# Patient Record
Sex: Male | Born: 1955
Health system: Southern US, Community
[De-identification: ages and names within clinical notes are randomized; demographics above are authoritative.]

## PROBLEM LIST (undated history)

## (undated) DIAGNOSIS — T8859XA Other complications of anesthesia, initial encounter: Secondary | ICD-10-CM

## (undated) DIAGNOSIS — E785 Hyperlipidemia, unspecified: Secondary | ICD-10-CM

## (undated) DIAGNOSIS — F32A Depression, unspecified: Secondary | ICD-10-CM

## (undated) DIAGNOSIS — F1910 Other psychoactive substance abuse, uncomplicated: Secondary | ICD-10-CM

## (undated) DIAGNOSIS — T4145XA Adverse effect of unspecified anesthetic, initial encounter: Secondary | ICD-10-CM

## (undated) DIAGNOSIS — F419 Anxiety disorder, unspecified: Secondary | ICD-10-CM

## (undated) DIAGNOSIS — IMO0002 Reserved for concepts with insufficient information to code with codable children: Secondary | ICD-10-CM

## (undated) DIAGNOSIS — T7840XA Allergy, unspecified, initial encounter: Secondary | ICD-10-CM

## (undated) DIAGNOSIS — I10 Essential (primary) hypertension: Secondary | ICD-10-CM

## (undated) DIAGNOSIS — K219 Gastro-esophageal reflux disease without esophagitis: Secondary | ICD-10-CM

## (undated) DIAGNOSIS — F329 Major depressive disorder, single episode, unspecified: Secondary | ICD-10-CM

## (undated) DIAGNOSIS — K5792 Diverticulitis of intestine, part unspecified, without perforation or abscess without bleeding: Secondary | ICD-10-CM

## (undated) HISTORY — PX: SPINE SURGERY: SHX786

## (undated) HISTORY — DX: Other psychoactive substance abuse, uncomplicated: F19.10

## (undated) HISTORY — PX: OTHER SURGICAL HISTORY: SHX169

## (undated) HISTORY — DX: Diverticulitis of intestine, part unspecified, without perforation or abscess without bleeding: K57.92

## (undated) HISTORY — PX: NECK SURGERY: SHX720

## (undated) HISTORY — DX: Depression, unspecified: F32.A

## (undated) HISTORY — PX: HERNIA REPAIR: SHX51

## (undated) HISTORY — DX: Allergy, unspecified, initial encounter: T78.40XA

## (undated) HISTORY — DX: Major depressive disorder, single episode, unspecified: F32.9

## (undated) HISTORY — DX: Gastro-esophageal reflux disease without esophagitis: K21.9

## (undated) HISTORY — DX: Hyperlipidemia, unspecified: E78.5

## (undated) HISTORY — DX: Anxiety disorder, unspecified: F41.9

## (undated) SURGERY — COLONOSCOPY WITH PROPOFOL
Anesthesia: Monitor Anesthesia Care

---

## 2004-04-19 ENCOUNTER — Ambulatory Visit (HOSPITAL_COMMUNITY): Admission: RE | Admit: 2004-04-19 | Discharge: 2004-04-19 | Payer: Self-pay | Admitting: Internal Medicine

## 2006-01-10 ENCOUNTER — Ambulatory Visit: Payer: Self-pay | Admitting: Internal Medicine

## 2006-02-13 ENCOUNTER — Ambulatory Visit (HOSPITAL_COMMUNITY): Admission: RE | Admit: 2006-02-13 | Discharge: 2006-02-13 | Payer: Self-pay | Admitting: Internal Medicine

## 2006-02-13 ENCOUNTER — Ambulatory Visit: Payer: Self-pay | Admitting: Internal Medicine

## 2011-01-14 NOTE — Op Note (Signed)
NAME:  James Simpson, James Simpson                         ACCOUNT NO.:  [id number]   MEDICAL RECORD NO.:  [id number]                   PATIENT TYPE:  AMB   LOCATION:  DAY                                  FACILITY:  [facility]   PHYSICIAN:  Lionel December, M.D.                 DATE OF BIRTH:  10-16-1955   DATE OF PROCEDURE:  04/19/2004  DATE OF DISCHARGE:                                 OPERATIVE REPORT   PROCEDURE:  Total colonoscopy with polypectomy.   INDICATIONS:  James Simpson is a 55 year old Caucasian male who has intermittent  episodes of rectal pain usually occurring at night without any other  symptoms. 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 Family history is positive  for colon carcinoma in his mother. He is therefore undergoing high-risk  screening colonoscopy. Procedure and risks was reviewed with the patient and  informed consent was obtained.   PREOPERATIVE MEDICATIONS:  Demerol 50 mg IV, Versed 10 mg IV in divided  doses.   FINDINGS:  Procedure performed in endoscopy suite. The patient's vital signs  and O2 saturations were monitored during procedure and remained stable. The  patient was placed in the left lateral position and rectal examination  performed. No abnormality noted on external or digital exam. Olympus video  scope was placed in the rectum and advanced into sigmoid colon. A few small  diverticula were noted. Preparation was felt to be satisfactory. Scope was  easily passed to the cecum which was identified by appendiceal orifice and  ileocecal valve. There was a 6 to 7 mm flat polyp on a fold below the  appendiceal orifice. This was raised with submucosal injection of saline and  snared in one piece. As the scope was withdrawn, the rest of the colonic  mucosa was carefully examined. There was another polyp at descending colon.  This was cold snared and partly coagulated using snare tip. Small piece of  this polyp was retrieved for histologic examination. The rest of the colonic  mucosa was normal. The rectal mucosal similarly was normal.  Scope was  retroflexed to examine anorectal junction, and small hemorrhoids were noted  below the dentate line. The scope was straightened and withdrawn. The  patient tolerated the procedure well.   FINAL DIAGNOSES:  1. Two polyps snared, one sessile polyp 6 to 7 mm snared from cecum and     other one from descending colon.  2. Few small diverticula at sigmoid colon.  3. Small external hemorrhoids.   RECOMMENDATIONS:  1. Standard instructions given.  2. High fiber diet.  3. Citrucel 1 tablespoonful daily.  4. He will use Levsin SL on p.r.n. basis. When he has pain, he can take 2 at     a time, no more than 6 in a 24-hour period.  5. I will be contacting patient with biopsy results for further     recommendations.      ___________________________________________  Lionel December, M.D.   NR/MEDQ  D:  04/19/2004  T:  04/19/2004  Job:  [id number]   cc:   Wyvonnia Lora  [address]  [place]  [place] [address]  Fax: [phone]

## 2011-01-14 NOTE — H&P (Signed)
NAME:  James Simpson, James Simpson NO.:  [id number]   MEDICAL RECORD NO.:  [id number]           PATIENT TYPE:   LOCATION:                                 FACILITY:   PHYSICIAN:  Lionel December, M.D.    DATE OF BIRTH:  10/24/1955   DATE OF ADMISSION:  DATE OF DISCHARGE:  LH                                HISTORY & PHYSICAL   PRIMARY CARE PHYSICIAN:  Dr. Margo Common   REASON FOR CONSULTATION:  Follow up diverticulitis.   HISTORY OF PRESENT ILLNESS:  Mr. Mahler is a 55 year old Caucasian male  who developed diverticulitis the evening of November 23, 2005.  He began to  have severe left lower quadrant pain, rated the pain 10/10 on pain scale,  was associated with diarrhea as well as some rectal bleeding.  He did have a  low-grade fever.  He states he had been consuming a good amount of almonds  and peanuts prior to the onset of this.  He also was found to have  microscopic hematuria as well.  He was hospitalized for about 5 days, was  treated with Cipro and Flagyl, and responded well to this.  Rarely he has a  twinge of left lower quadrant residual pain but nothing significant.  His  bowel movements are now normal, once a day, without any further rectal  bleeding.  They are soft and brown.  His appetite has returned to normal.  He has no further fever or chills, nausea or vomiting.  He is doing well.   While hospitalized, his CT scan on November 24, 2005, showed inflammatory  changes seen adjacent to the mid sigmoid colon consistent with  diverticulitis.  This study, however, was done without contrast.  He had  white blood cell count of 11,000 at the time of admission.  This later  normalized.  His last colonoscopy was by Dr. Karilyn Cota on April 19, 2004.  He  had two polyps removed - one was inflamed and focally adenomatous measuring  8 x 3 mm from the cecum.  The second was from the descending colon which was  a small inflamed adenomatous polyp.  He was also found to have a few small  diverticula at the sigmoid colon and small external hemorrhoids.  Family  history is positive for his mother diagnosed in her 98s with a colon cancer.   PAST MEDICAL HISTORY:  1.  Colonoscopy with history of adenomatous polyps.  2.  Diverticulosis as described in HPI.  3.  He had a right rotator cuff repair.  4.  History of EGD in February 1995 by Dr. Jena Gauss for dyspepsia which showed      erosive reflux esophagitis.  5.  Right inguinal herniorrhaphy.  6.  Total fixation of a left clavicular fracture which he had in 1978.  Positive for his mother diagnosed in her 4s with a colon cancer.   CURRENT MEDICATIONS:  Cod liver oil one teaspoon in the morning.   ALLERGIES:  PENICILLIN, unknown reaction.   FAMILY HISTORY:  Positive for mother diagnosed in her 8s with colon cancer,  deceased at  age 78.  Father deceased at age 63 secondary to MI.  He has  three healthy siblings.   SOCIAL HISTORY:  Mr. Beckom is in his second marriage of 21 years.  He has  one son that resides with he and his wife, another grown healthy child.  He  is employed full-time with [employer].  He denies any tobacco or  drug use.  He drinks an occasional beer a couple of times a month.   REVIEW OF SYSTEMS:  CONSTITUTIONAL:  Weight is stable.  Denies any anorexia.  Denies any fever or chills.  Denies any __________.  CARDIOVASCULAR:  Denies  chest pain, palpitation.  PULMONARY:  Denies any shortness of breath,  dyspnea, cough, hemoptysis. GI:  See HPI.  Denies any heartburn,  indigestion, dysphagia, or odynophagia.  GU:  Denies any dysuria, gross  hematuria, increased urinary frequency.  He has had microscopic hematuria  and he is followed by Dr. Margo Common for this.   PHYSICAL EXAMINATION:  VITAL SIGNS:  Weight 242 pounds, height 70 inches,  temperature 98 degrees, blood pressure 142/90, pulse 80.  GENERAL:  Mr. Hanna is a 55 year old Caucasian male who is alert and  pleasant, obese, and cooperative  in no acute distress.  HEENT:  Normocephalic.  Sclerae clear, nonicteric.  Conjunctivae pink.  Oropharynx pink and moist without any lesions.  NECK:  Supple without mass or thyromegaly.  CHEST:  Heart regular rate and rhythm, normal S1, S2, without murmurs, rubs,  or gallops.  LUNGS:  Clear to auscultation bilaterally.  ABDOMEN:  Protuberant with positive bowel sounds x4, no bruits ausculted.  Soft, nontender, nondistended, without palpable mass or hepatosplenomegaly.  No rebound tenderness or guarding.  RECTAL:  Deferred.  EXTREMITIES:  Without clubbing or edema bilaterally.  SKIN:  Pink, warm, and dry with multiple macules, no rash or jaundice.   LABORATORY STUDIES:  See HPI.   IMPRESSION:  Mr. Mcaulay is a 55 year old Caucasian male with first bout of  diverticulosis which began end of March.  He was treated with antibiotics  and responded nicely, was hospitalized for about 5 days at [facility].  He has recovered well.  He has history of adenomatous polyps as  well as family history of colorectal carcinoma.  Therefore, we are going to  pursue a repeat colonoscopy, given findings of recent CT where there is what  appears to be acute sigmoid diverticulitis has resolved and to be sure that  there is no occult mass.   PLAN:  1.  Colonoscopy with Dr. Karilyn Cota in the near future.  I have discussed the      procedure including risks and benefits      to include but not limited to bleeding, infection, perforation, drug      reaction.  He agrees with plan.  Consent will be obtained.  2.  Further recommendations pending procedure.      Nicholas Lose, N.P.      Lionel December, M.D.  Electronically Signed    KC/MEDQ  D:  01/10/2006  T:  01/10/2006  Job:  [id number]   cc:   Wyvonnia Lora  Fax: [phone]   Dr. Juventino Slovak, [place]

## 2011-01-14 NOTE — Op Note (Signed)
NAME:  James Simpson, James Simpson NO.:  [id number]   MEDICAL RECORD NO.:  [id number]          PATIENT TYPE:  AMB   LOCATION:  DAY                           FACILITY:  [facility]   PHYSICIAN:  Lionel December, M.D.    DATE OF BIRTH:  March 26, 1956   DATE OF PROCEDURE:  02/13/2006  DATE OF DISCHARGE:                                 OPERATIVE REPORT   PROCEDURE:  Colonoscopy.   INDICATIONS:  [name] is a 55 year old Caucasian male who was recently treated  at [facility] in [place], [place] for diverticulitis.  He has completely  recovered with antibiotic therapy, although he still has some internal mild  soreness across lower abdomen.  He also has history of polyps.  His last  exam was on August 2005 with removal of two tubular adenomas.  Family  history is positive for colon carcinoma in his mother and two maternal  aunts.  Given history of recent diverticulitis, we decided to reexamine his  colon to make sure he does not have a neoplastic process mimicking the  diverticulitis.  Procedure risks were reviewed with the patient, informed  consent was obtained.   MEDS FOR CONSCIOUS SEDATION:  Demerol 50 mg IV, Versed 4 mg IV.   FINDINGS:  Procedure performed in endoscopy suite.  The patient's vital  signs and O2 sat were monitored during procedure and remained stable.  The  patient was placed in left lateral position.  Rectal examination performed.  No abnormality noted on external or digital exam.  Olympus videoscope was  placed in rectum and advanced under vision into sigmoid colon where few  scattered diverticula noted.  There were no mucosal abnormalities suggest  colitis.  Preparation was excellent.  Scope was passed into cecum which was  identified by appendiceal orifice and ileocecal valve.  Short segment of TI  was also examined was normal.  As the scope was withdrawn colonic mucosa was  examined for the second time and no polyps or neoplasm was noted.  Rectal  mucosa was normal.  Scope  was retroflexed to examine anorectal junction.  A  small hemorrhoids noted below the dentate line.  Endoscope was then  withdrawn.  The patient tolerated the procedure well.   FINAL DIAGNOSIS:  1.  Mild-to-moderate sigmoid colon diverticulosis.  2.  No evidence of neoplasm or recurrent polyps.  3.  Small external hemorrhoids.   RECOMMENDATIONS:  High-fiber diet plus Fiberchoice 2 tablets daily.  He  should continue with yearly Hemoccults and consider next exam in 5 years  from now, which would be in June 2012.      Lionel December, M.D.  Electronically Signed     NR/MEDQ  D:  02/13/2006  T:  02/14/2006  Job:  [id number]   cc:   Wyvonnia Lora  Fax: [phone]

## 2011-01-14 NOTE — Consult Note (Signed)
NAME:  James Simpson, SCHOOLS NO.:  [id number]   MEDICAL RECORD NO.:  [id number]                   PATIENT TYPE:   LOCATION:                                       FACILITY:   PHYSICIAN:  Lionel December, M.D.                 DATE OF BIRTH:  Apr 27, 1956   DATE OF CONSULTATION:  03/29/2004  DATE OF DISCHARGE:                                   CONSULTATION   PRESENTING COMPLAINT:  Rectal pain.  Family history of colon carcinoma.  The  patient interested in colonoscopy.   HISTORY OF PRESENT ILLNESS:  Dam is a 55 year old Caucasian male who is  referred through courtesy of Dr. Margo Common for GI evaluation.  He states he has  been having sporadic intermittent episodes of rectal pain starting about a  year ago.  He believes he has had four or five episodes.  All of these  episodes have occurred at night between 2 and 3 a.m.  He wakes up with  excruciating pain in his rectum.  He feels nauseated, becomes diaphoretic,  dizzy and feels flushed.  Each episode has lasted anywhere from 10-25  minutes.  During this process he may have one or two bowel movements, but  these are normal.  Between these episodes he has no problems whatsoever.  His bowels generally move daily.  He denies melena, rectal bleeding or  anorexia or weight loss.  He has very good appetite.  He also denies  dysuria, hematuria and has not any difficulty with sexual activity and does  not recall that this pain occurred following sexual activity.  He denies  heartburn or chest pain.  He is on herbal supplements which are MSM, red  yeast, fionine, silymarin.  He was given Levsin /SL to be used on p.r.n.  basis by Dr. Margo Common, but he has not taken this since he has not had any  pain.  He did have rectal exam by him which was within normal limits.   PAST MEDICAL HISTORY:  He had an EGD back in February 1995 by Dr. Jena Gauss for  dyspeptic symptoms.  He had erosive reflux esophagitis.  He was treated with  PPI for a  while, but has remained asymptomatic off therapy.  Prior surgeries  include right inguinal herniorrhaphy as a child, total fixation of left  clavicular fracture that he had in 1978 while skiing and more recently had  surgery for right rotator cuff tear in 1999 or 2000.   ALLERGIES:  PENICILLIN.  Reaction unknown.  He apparently was given this  when he was a child.   FAMILY HISTORY:  Mother was diagnosed with colon carcinoma at age 15 and  died within 10 months of metastatic disease.  Father had coronary artery  disease, had a coronary artery bypass graft at 26 and died of myocardial  infarction at 31.  He has two sisters and one brother.  One sister has  hypertension, depression.  Another sister is diabetic and has thyroid  disease as well as pacemaker.  His brother is doing fairly well.   SOCIAL HISTORY:  He is married and he has two children.  He worked for a  [employer], but for the last four years he has been working at [employer] in __________.  He has never smoked cigarettes and drinks alcohol  very occasionally.   PHYSICAL EXAMINATION:  GENERAL:  Pleasant mildly obese Caucasian male who is  in no acute distress.  VITAL SIGNS:  He weighs 242 pounds. He is 5 feet 10 1/2 inches tall, pulse  78 per minute, blood pressure 120/88.  He is afebrile.  HEENT:  Conjunctivae is pink.  Sclerae is nonicteric.  Oropharyngeal mucosa  is normal.  No neck masses are noted.  CARDIAC:  Regular rhythm.  Normal S1, S2.  No murmur or gallop noted.  LUNGS:  Clear to auscultation.  ABDOMEN:  Full, but soft and nontender without organomegaly or masses.  GENITALIA:  External genitalia is within normal limits.  RECTAL:  Deferred as he had one by Dr. Margo Common and he will get another at  time of colonoscopy.  EXTREMITIES:  No clubbing or edema noted.   ASSESSMENT:  Lajarvis is a healthy-appearing 55 year old Caucasian male with  sporadic episodes of excruciating rectal pain occurring usually at  night.  He does not have any other worrisome symptoms.  I agree he is either having  rectal spasms or he has proctalgia fugax.  I agree in trial with Levsin /SL.   Family history of colon carcinoma in mother.  He therefore needs to undergo  high risk screening colonoscopy.   RECOMMENDATIONS:  Total colonoscopy to be performed at [facility] in near future.  I have reviewed with the procedure and risks with the patient and he is  agreeable.   I have asked him to keep a written record when he has these episodes of pain  and look at the day before to see if he did something unusual or different  than his routine.  I may have him come back for follow visit in six months  or so.   I would like to thank Dr. Margo Common for the opportunity to participate in the  care of this gentleman.      ___________________________________________                                            Lionel December, M.D.   NR/MEDQ  D:  03/29/2004  T:  03/29/2004  Job:  [id number]   cc:   Wyvonnia Lora  [address]  [place]  [place] [address]  Fax: [phone]   [facility]

## 2011-02-10 ENCOUNTER — Other Ambulatory Visit (INDEPENDENT_AMBULATORY_CARE_PROVIDER_SITE_OTHER): Payer: Self-pay | Admitting: Internal Medicine

## 2011-02-10 ENCOUNTER — Ambulatory Visit (HOSPITAL_COMMUNITY)
Admission: RE | Admit: 2011-02-10 | Discharge: 2011-02-10 | Disposition: A | Discharge status: Home / Self Care | Payer: BC Managed Care – PPO | Source: Ambulatory Visit | Attending: Internal Medicine | Admitting: Internal Medicine

## 2011-02-10 ENCOUNTER — Encounter (HOSPITAL_BASED_OUTPATIENT_CLINIC_OR_DEPARTMENT_OTHER): Payer: BC Managed Care – PPO | Admitting: Internal Medicine

## 2011-02-10 DIAGNOSIS — D126 Benign neoplasm of colon, unspecified: Secondary | ICD-10-CM | POA: Insufficient documentation

## 2011-02-10 DIAGNOSIS — K644 Residual hemorrhoidal skin tags: Secondary | ICD-10-CM | POA: Insufficient documentation

## 2011-02-10 DIAGNOSIS — K5730 Diverticulosis of large intestine without perforation or abscess without bleeding: Secondary | ICD-10-CM

## 2011-02-10 DIAGNOSIS — Z860100 Personal history of colon polyps, unspecified: Secondary | ICD-10-CM | POA: Insufficient documentation

## 2011-02-10 DIAGNOSIS — Z800 Family history of malignant neoplasm of digestive organs: Secondary | ICD-10-CM

## 2011-02-10 DIAGNOSIS — Z09 Encounter for follow-up examination after completed treatment for conditions other than malignant neoplasm: Secondary | ICD-10-CM | POA: Insufficient documentation

## 2011-02-28 NOTE — Op Note (Signed)
NAME:  DEMETRY, BENDICKSON NO.:  [id number]  MEDICAL RECORD NO.:  [id number]  LOCATION:  DAYP                          FACILITY:  [facility]  PHYSICIAN:  Lionel December, M.D.    DATE OF BIRTH:  15-Jun-1956  DATE OF PROCEDURE:  02/10/2011 DATE OF DISCHARGE:                              OPERATIVE REPORT   PROCEDURE:  Colonoscopy with polypectomy.  INDICATIONS:  James Simpson is a 55 year old Caucasian male who has a history of colonic polyps as well as family history of colon carcinoma.  He had 2 tubular adenomas removed in 2005, but his last exam in June 2007 was unremarkable.  He is now coming for surveillance colonoscopy.  He states he has been treated for diverticulitis at least 6 times in his lifetime but never had an abscess and most of his therapies were outpatient.  His mother had colon carcinoma in late 20s and died in her 75s of metastatic disease and he has lost 2 maternal aunts also had colon carcinoma either in their late 78s or early 69s.  Procedure risks were reviewed with the patient.  Informed consent was obtained.  MEDICATIONS FOR CONSCIOUS SEDATION:  Demerol 50 mg IV, Versed 5 mg IV.  FINDINGS:  Procedure performed in endoscopy suite.  The patient's vital signs and O2 saturations were monitored during the procedure and remained stable.  The patient was placed in left lateral recumbent position and rectal examination performed.  No abnormality noted on external or digital exam.  Pentax videoscope was placed in the rectum and advanced under vision into sigmoid colon and beyond.  He had moderate number of diverticula in the sigmoid colon, but these were small to medium sized.  Preparation was excellent.  Scope was passed into cecum which was identified by appendiceal orifice and ileocecal valve.  To the right of appendiceal orifice was a flat or sessile polyp. It was about 6 x 8 mm.  It was elevated with saline injection and snared.  Mucosa around the polypectomy  site was treated with APC just to be sure that all of the lesion had been treated.  There was 3 to 4 mm polyp at ascending colon which was removed via  biopsy.  Initially I tried a snare, but the snare slipped over it, so it was taken out with the cold biopsy forceps.  There was another 6 to 7-mm polyp at distal sigmoid colon.  This polyp was snared.  Most of the polyp was coagulated during this process.  Small portion ofthis polyp was obtained for  histologic exam.  Rectal mucosa was normal. Scope was retroflexed to examine anorectal junction and small hemorrhoids noted below the dentate line.   Endoscope was straightened and withdrawn.  Withdrawal time was 37 minutes.   The patient tolerated the procedure well.  FINAL DIAGNOSES: 1. Examination performed to cecum. 2. A 6 x 8-mm sessile polyp snared from cecum following injection of     saline.  The polypectomy site was also treated with APC. 3. Small polyp ablated via cold biopsy from ascending colon and     another small polyp snared from distal sigmoid colon. 4. Moderate number of diverticula at sigmoid colon.  Small external     hemorrhoids.  RECOMMENDATIONS: 1. No aspirin or NSAIDs for 2 weeks. 2. He should continue with high-fiber diet and fiber supplement.  I     will be contacting the patient results of biopsy and further     recommendations.          ______________________________ Lionel December, M.D.     NR/MEDQ  D:  02/10/2011  T:  02/10/2011  Job:  [id number]  Electronically Signed by Lionel December M.D. on 02/28/2011 12:51:22 AM

## 2013-03-18 ENCOUNTER — Ambulatory Visit (INDEPENDENT_AMBULATORY_CARE_PROVIDER_SITE_OTHER): Payer: BC Managed Care – PPO | Admitting: Internal Medicine

## 2013-03-18 ENCOUNTER — Encounter (INDEPENDENT_AMBULATORY_CARE_PROVIDER_SITE_OTHER): Payer: Self-pay | Admitting: Internal Medicine

## 2013-03-18 VITALS — BP 144/90 | HR 76 | Temp 98.3°F | Ht 71.0 in | Wt 247.3 lb

## 2013-03-18 DIAGNOSIS — K5732 Diverticulitis of large intestine without perforation or abscess without bleeding: Secondary | ICD-10-CM

## 2013-03-18 MED ORDER — CIPROFLOXACIN HCL 500 MG PO TABS: 500.0000 mg | ORAL_TABLET | Freq: Two times a day (BID) | ORAL | Status: DC

## 2013-03-18 MED ORDER — METRONIDAZOLE 500 MG PO TABS: 500.0000 mg | ORAL_TABLET | Freq: Two times a day (BID) | ORAL | Status: DC

## 2013-03-18 NOTE — Patient Instructions (Addendum)
Referral to Dr. Lovell Sheehan. Cipro and Flagyl x 10 days . Next bout he will call our office for a CT.

## 2013-03-18 NOTE — Progress Notes (Signed)
Subjective:     Patient ID: James Simpson, male   DOB: 1955/12/29, 57 y.o.   MRN: [id number]  HPI Here today for f/u. He tells me he is still having flares from the diverticulitis. He tells me he had a diverticular flare 1 1/2 weeks ago. He was taking Cipro and Flagyl for the diverticulitis. Usually takes for 7 days. He was treated by Dr. Loney Hering. No xrays were taken.  He has had 5 episodes of diverticulitis in the past 8-9 months. He tells me he had the same symptoms as before when he had diverticulitis.  He had left lower abdominal pain. He also tells me he doesn't void like he should when he has a flare.  He also bloats and passes gas. During episodes of diverticulitis he becomes constipated. CT in 2007 revealed inflammatory changes seen adjacent to the mid sigmoid colon consistent with diverticulitis. Appetitie is good. No weight loss. Usually has a BM daily. No melena or bright red rectal bleeding.     02/10/2011 Colonscopy with polypectomy   1. Examination performed to cecum.   2. A 6 x 8-mm sessile polyp snared from cecum following injection of  saline. The polypectomy site was also treated with APC.  3. Small polyp ablated via cold biopsy from ascending colon and  another small polyp snared from distal sigmoid colon.  4. Moderate number of diverticula at sigmoid colon. Small external  hemorrhoids.   Review of Systems see hpi Current Outpatient Prescriptions  Medication Sig Dispense Refill  . ciprofloxacin (CIPRO) 500 MG tablet Take 1 tablet (500 mg total) by mouth 2 (two) times daily.  20 tablet  0  . metroNIDAZOLE (FLAGYL) 500 MG tablet Take 1 tablet (500 mg total) by mouth 2 (two) times daily.  20 tablet  0   No current facility-administered medications for this visit.   No current outpatient prescriptions on file prior to visit.   No current facility-administered medications on file prior to visit.   Past Surgical History  Procedure Laterality Date  . Hernia repair     child  . Rt rotator cuff     Allergies  Allergen Reactions  . Penicillins     Unknown reaction       Objective:   Physical Exam  Filed Vitals:   03/18/13 1554  BP: 144/90  Pulse: 76  Temp: 98.3 F (36.8 C)  Height: 5\' 11"  (1.803 m)  Weight: 247 lb 4.8 oz (112.175 kg)   Alert and oriented. Skin warm and dry. Oral mucosa is moist.   . Sclera anicteric, conjunctivae is pink. Thyroid not enlarged. No cervical lymphadenopathy. Lungs clear. Heart regular rate and rhythm.  Abdomen is soft. Bowel sounds are positive. No hepatomegaly. No abdominal masses felt. No tenderness.  No edema to lower extremities.       Assessment:    Recent hx of diverticulitis. Dr. Karilyn Cota in with patient.  Patient is over bout at this time. Asymptomatic at this time.     Plan:     Referral to Dr. Lovell Sheehan. Cipro and Flagyl x 10 days. He will call our office when his next bout occurs and we will CT him.

## 2013-03-26 ENCOUNTER — Telehealth (INDEPENDENT_AMBULATORY_CARE_PROVIDER_SITE_OTHER): Payer: Self-pay | Admitting: *Deleted

## 2013-03-26 ENCOUNTER — Other Ambulatory Visit (INDEPENDENT_AMBULATORY_CARE_PROVIDER_SITE_OTHER): Payer: Self-pay | Admitting: Internal Medicine

## 2013-03-26 DIAGNOSIS — K5792 Diverticulitis of intestine, part unspecified, without perforation or abscess without bleeding: Secondary | ICD-10-CM

## 2013-03-26 NOTE — Telephone Encounter (Signed)
Advised to go to the ED. I will however order a CT abdomen/pelvis with CM. He does not want to go.

## 2013-03-26 NOTE — Telephone Encounter (Signed)
Saw you last week.  Was told to take antibx --pain in not letting up and was told if it continued that we may order a scan.   He would like to proceed if that is what you think needs to be done.  Please call him with your recommendations [phone]

## 2013-03-27 NOTE — Progress Notes (Signed)
Patient has to have CT sch'd through [facility], they are aware and will call patient with appt

## 2013-03-28 ENCOUNTER — Other Ambulatory Visit (INDEPENDENT_AMBULATORY_CARE_PROVIDER_SITE_OTHER): Payer: Self-pay | Admitting: Internal Medicine

## 2013-03-28 ENCOUNTER — Telehealth (INDEPENDENT_AMBULATORY_CARE_PROVIDER_SITE_OTHER): Payer: Self-pay | Admitting: Internal Medicine

## 2013-03-28 DIAGNOSIS — K5732 Diverticulitis of large intestine without perforation or abscess without bleeding: Secondary | ICD-10-CM

## 2013-03-28 MED ORDER — CIPROFLOXACIN HCL 500 MG PO TABS: 500.0000 mg | ORAL_TABLET | Freq: Two times a day (BID) | ORAL | Status: DC

## 2013-03-28 MED ORDER — METRONIDAZOLE 500 MG PO TABS: 250.0000 mg | ORAL_TABLET | Freq: Three times a day (TID) | ORAL | Status: DC

## 2013-03-28 NOTE — Telephone Encounter (Signed)
CT abdomen/pelvis reviewed with patient. Patient has acute diverticulitis. I advised him if his pain worsens or he feels he needs to go to the ED, he needs to go. I eprescribed an Rx for Cipro and Flagyl to his pharmacy. He has an appt with Dr. Lovell Sheehan on the 5th of August.

## 2013-04-01 ENCOUNTER — Encounter (HOSPITAL_COMMUNITY): Payer: Self-pay | Admitting: Emergency Medicine

## 2013-04-01 ENCOUNTER — Emergency Department (HOSPITAL_COMMUNITY)
Admission: EM | Admit: 2013-04-01 | Discharge: 2013-04-01 | Disposition: A | Discharge status: Home / Self Care | Payer: BC Managed Care – PPO | Attending: Emergency Medicine | Admitting: Emergency Medicine

## 2013-04-01 ENCOUNTER — Emergency Department (HOSPITAL_COMMUNITY): Payer: BC Managed Care – PPO

## 2013-04-01 DIAGNOSIS — K5732 Diverticulitis of large intestine without perforation or abscess without bleeding: Secondary | ICD-10-CM | POA: Insufficient documentation

## 2013-04-01 DIAGNOSIS — Z880 Allergy status to penicillin: Secondary | ICD-10-CM | POA: Insufficient documentation

## 2013-04-01 DIAGNOSIS — R109 Unspecified abdominal pain: Secondary | ICD-10-CM | POA: Insufficient documentation

## 2013-04-01 DIAGNOSIS — K5792 Diverticulitis of intestine, part unspecified, without perforation or abscess without bleeding: Secondary | ICD-10-CM

## 2013-04-01 DIAGNOSIS — K5900 Constipation, unspecified: Secondary | ICD-10-CM | POA: Insufficient documentation

## 2013-04-01 DIAGNOSIS — Z792 Long term (current) use of antibiotics: Secondary | ICD-10-CM | POA: Insufficient documentation

## 2013-04-01 DIAGNOSIS — Z79899 Other long term (current) drug therapy: Secondary | ICD-10-CM | POA: Insufficient documentation

## 2013-04-01 LAB — COMPREHENSIVE METABOLIC PANEL
ALT: 42 U/L (ref 0–53)
AST: 45 U/L — ABNORMAL HIGH (ref 0–37)
Albumin: 3.7 g/dL (ref 3.5–5.2)
CO2: 24 mEq/L (ref 19–32)
Calcium: 9.3 mg/dL (ref 8.4–10.5)
Chloride: 104 mEq/L (ref 96–112)
GFR calc non Af Amer: 74 mL/min — ABNORMAL LOW (ref >90)
Sodium: 139 mEq/L (ref 135–145)

## 2013-04-01 LAB — CBC WITH DIFFERENTIAL/PLATELET
Basophils Absolute: 0.1 10*3/uL (ref 0.0–0.1)
Lymphocytes Relative: 27 % (ref 12–46)
Neutro Abs: 3.5 10*3/uL (ref 1.7–7.7)
Platelets: 195 10*3/uL (ref 150–400)
RDW: 12.4 % (ref 11.5–15.5)
WBC: 5.9 10*3/uL (ref 4.0–10.5)

## 2013-04-01 LAB — URINALYSIS, ROUTINE W REFLEX MICROSCOPIC
Glucose, UA: NEGATIVE mg/dL
Ketones, ur: NEGATIVE mg/dL
Leukocytes, UA: NEGATIVE
Specific Gravity, Urine: 1.015 (ref 1.005–1.030)
pH: 6.5 (ref 5.0–8.0)

## 2013-04-01 LAB — URINE MICROSCOPIC-ADD ON

## 2013-04-01 LAB — OCCULT BLOOD, POC DEVICE: Fecal Occult Bld: NEGATIVE

## 2013-04-01 MED ORDER — SODIUM CHLORIDE 0.9 % IV BOLUS (SEPSIS)
1000.0000 mL | Freq: Once | INTRAVENOUS | Status: AC
  Administered 2013-04-01: 1000 mL via INTRAVENOUS

## 2013-04-01 MED ORDER — HYDROMORPHONE HCL PF 1 MG/ML IJ SOLN
1.0000 mg | Freq: Once | INTRAMUSCULAR | Status: AC
  Administered 2013-04-01: 1 mg via INTRAVENOUS
  Filled 2013-04-01: qty 1

## 2013-04-01 MED ORDER — METRONIDAZOLE IN NACL 5-0.79 MG/ML-% IV SOLN
500.0000 mg | Freq: Once | INTRAVENOUS | Status: AC
  Administered 2013-04-01: 500 mg via INTRAVENOUS
  Filled 2013-04-01: qty 100

## 2013-04-01 MED ORDER — IOHEXOL 300 MG/ML  SOLN
100.0000 mL | Freq: Once | INTRAMUSCULAR | Status: AC | PRN
  Administered 2013-04-01: 100 mL via INTRAVENOUS

## 2013-04-01 MED ORDER — KETOROLAC TROMETHAMINE 30 MG/ML IJ SOLN
30.0000 mg | Freq: Once | INTRAMUSCULAR | Status: AC
  Administered 2013-04-01: 30 mg via INTRAVENOUS
  Filled 2013-04-01: qty 1

## 2013-04-01 MED ORDER — FLEET ENEMA 7-19 GM/118ML RE ENEM
1.0000 | ENEMA | Freq: Once | RECTAL | Status: AC
  Administered 2013-04-01: 1 via RECTAL

## 2013-04-01 MED ORDER — ONDANSETRON HCL 4 MG PO TABS: 4.0000 mg | ORAL_TABLET | Freq: Four times a day (QID) | ORAL | Status: DC

## 2013-04-01 MED ORDER — OXYCODONE-ACETAMINOPHEN 5-325 MG PO TABS: 2.0000 | ORAL_TABLET | ORAL | Status: DC | PRN

## 2013-04-01 MED ORDER — IOHEXOL 300 MG/ML  SOLN
50.0000 mL | Freq: Once | INTRAMUSCULAR | Status: AC | PRN
  Administered 2013-04-01: 50 mL via ORAL

## 2013-04-01 MED ORDER — CIPROFLOXACIN IN D5W 400 MG/200ML IV SOLN
400.0000 mg | Freq: Once | INTRAVENOUS | Status: AC
  Administered 2013-04-01: 400 mg via INTRAVENOUS
  Filled 2013-04-01: qty 200

## 2013-04-01 MED ORDER — ONDANSETRON HCL 4 MG/2ML IJ SOLN
4.0000 mg | Freq: Once | INTRAMUSCULAR | Status: AC
  Administered 2013-04-01: 4 mg via INTRAVENOUS
  Filled 2013-04-01: qty 2

## 2013-04-01 NOTE — ED Notes (Signed)
Ginger ale provided for PO challenge

## 2013-04-01 NOTE — ED Provider Notes (Signed)
CSN: [id number]     Arrival date & time 04/01/13  1010 History  This chart was scribed for James Octave, MD by Bennett Scrape, ED Scribe. This patient was seen in room APA01/APA01 and the patient's care was started at 10:27 AM.   First MD Initiated Contact with Patient 04/01/13 1023     Chief Complaint  Patient presents with  . Abdominal Pain    The history is provided by the patient. No language interpreter was used.    HPI Comments: James Simpson is a 57 y.o. male who presents to the Emergency Department complaining of 2 weeks of waxing and waning lower abdominal pain with associated constipation described as no BM and decreased flatulence that he attributes to diverticulitis. He states that the pain started in the LLQ area and is most painful in the left abdomen. He reports that he has a h/o diverticulitis since 2007 with the first episode requiring admission. He states that he was seen by Dr. Karilyn Cota 3 days ago for the same pain and was diagnosed with diverticulitis through CT scan. He was started on Cipro and Flagyl with worsening of symptoms. He has tried an all liquid diet with no improvement. He admits that he was on antibiotics two weeks ago or the same with mild improvement but states that the symptoms returned once he finished the antibiotics. He denies being one any pain medication but has taken three 5 mg hydrocodones over the past 3 days. He states that he has already experienced 5 to 6 flare ups of the same this year and has an appointment with Dr. Lovell Sheehan tomorrow to discuss surgery. He denies having any prior abdominal surgeries. He denies fevers, emesis, hematuria, difficulty urinating and testicular pain as associated symptoms.    Past Medical History  Diagnosis Date  . Diverticulitis     2007   Past Surgical History  Procedure Laterality Date  . Hernia repair      child  . Rt rotator cuff     No family history on file. History  Substance Use Topics  . Smoking  status: Never Smoker   . Smokeless tobacco: Not on file  . Alcohol Use: No    Review of Systems  A complete 10 system review of systems was obtained and all systems are negative except as noted in the HPI and PMH.   Allergies  Penicillins  Home Medications   Current Outpatient Rx  Name  Route  Sig  Dispense  Refill  . ciprofloxacin (CIPRO) 500 MG tablet   Oral   Take 1 tablet (500 mg total) by mouth 2 (two) times daily.   28 tablet   0   . HYDROcodone-acetaminophen (NORCO/VICODIN) 5-325 MG per tablet   Oral   Take 1 tablet by mouth every 6 (six) hours as needed for pain.         . metroNIDAZOLE (FLAGYL) 500 MG tablet   Oral   Take 500 mg by mouth 2 (two) times daily.         . ondansetron (ZOFRAN) 4 MG tablet   Oral   Take 1 tablet (4 mg total) by mouth every 6 (six) hours.   12 tablet   0   . oxyCODONE-acetaminophen (PERCOCET/ROXICET) 5-325 MG per tablet   Oral   Take 2 tablets by mouth every 4 (four) hours as needed for pain.   15 tablet   0    Triage Vitals: BP 151/117  Pulse 95  Temp(Src) 98.1 F (  36.7 C) (Oral)  Resp 20  Wt 247 lb (112.038 kg)  BMI 34.46 kg/m2  SpO2 100%  Physical Exam  Nursing note and vitals reviewed. Constitutional: He is oriented to person, place, and time. He appears well-developed and well-nourished. No distress.  HENT:  Head: Normocephalic and atraumatic.  Eyes: Conjunctivae and EOM are normal.  Neck: Normal range of motion. Neck supple. No tracheal deviation present.  Cardiovascular: Normal rate, regular rhythm and normal heart sounds.   No murmur heard. Pulmonary/Chest: Effort normal and breath sounds normal. No respiratory distress. He has no wheezes. He has no rales.  Abdominal: Soft. Bowel sounds are normal. There is tenderness (llq, RLQ and suprapubic areas). There is guarding (voluntary). There is no rebound and no CVA tenderness.  Genitourinary: Guaiac negative stool.  Testicles in a normal lie, no tenderness to  the testicles, no inguinal hernias, no gross blood present, rectal vault is empty, chaperone present No fecal impaction  Musculoskeletal: Normal range of motion. He exhibits no edema.  Neurological: He is alert and oriented to person, place, and time. No cranial nerve deficit.  Skin: Skin is warm and dry.  Psychiatric: He has a normal mood and affect. His behavior is normal.    ED Course   Procedures (including critical care time)  Medications  sodium chloride 0.9 % bolus 1,000 mL (0 mLs Intravenous Stopped 04/01/13 1151)  ondansetron (ZOFRAN) injection 4 mg (4 mg Intravenous Given 04/01/13 1048)  HYDROmorphone (DILAUDID) injection 1 mg (1 mg Intravenous Given 04/01/13 1048)  ciprofloxacin (CIPRO) IVPB 400 mg (0 mg Intravenous Stopped 04/01/13 1230)  metroNIDAZOLE (FLAGYL) IVPB 500 mg (500 mg Intravenous New Bag/Given 04/01/13 1232)  iohexol (OMNIPAQUE) 300 MG/ML solution 50 mL (50 mLs Oral Contrast Given 04/01/13 1055)  iohexol (OMNIPAQUE) 300 MG/ML solution 100 mL (100 mLs Intravenous Contrast Given 04/01/13 1134)  sodium phosphate (FLEET) 7-19 GM/118ML enema 1 enema (1 enema Rectal Given 04/01/13 1256)  ketorolac (TORADOL) 30 MG/ML injection 30 mg (30 mg Intravenous Given 04/01/13 1252)  HYDROmorphone (DILAUDID) injection 1 mg (1 mg Intravenous Given 04/01/13 1355)    DIAGNOSTIC STUDIES: Oxygen Saturation is 100% on room air, normal by my interpretation.    COORDINATION OF CARE: 10:31 AM-Discussed treatment plan which includes rectal exam, medications, CT of abdomen, CBC panel, CMP and UA with pt at bedside and pt agreed to plan.  12:39 PM-Pt rechecked and feels improved with medications listed above. Informed pt that CT scan showed diverticulitis with no complications such as perforations or abscess and also showed a large amount of stool. Discussed enema with pt and pt agreed.  Labs Reviewed  COMPREHENSIVE METABOLIC PANEL - Abnormal; Notable for the following:    AST 45 (*)    GFR calc non Af  Amer 74 (*)    GFR calc Af Amer 86 (*)    All other components within normal limits  URINALYSIS, ROUTINE W REFLEX MICROSCOPIC - Abnormal; Notable for the following:    Hgb urine dipstick TRACE (*)    All other components within normal limits  CBC WITH DIFFERENTIAL  LIPASE, BLOOD  LACTIC ACID, PLASMA  URINE MICROSCOPIC-ADD ON  OCCULT BLOOD, POC DEVICE   Ct Abdomen Pelvis W Contrast  04/01/2013   *RADIOLOGY REPORT*  Clinical Data: Abdominal pain history of of diverticulitis.  No bowel movements since Friday  CT ABDOMEN AND PELVIS WITH CONTRAST  Technique:  Multidetector CT imaging of the abdomen and pelvis was performed following the standard protocol during bolus administration of intravenous  contrast.  Contrast: 50mL OMNIPAQUE IOHEXOL 300 MG/ML  SOLN, OMNIPAQUE IOHEXOL 300 MG/ML  SOLN  Comparison: CT scan 12/19/2011  Findings: Lung bases are unremarkable.  Sagittal images of the spine shows disc space flattening with vacuum disc phenomenon and anterior spurring at L5 S1 level.  Enhanced liver is unremarkable.  No calcified gallstones are noted within gallbladder.  Enhanced pancreas, spleen and adrenal glands are unremarkable.  Enhanced kidneys are symmetrical in size.  No hydronephrosis or hydroureter.  Delayed renal images shows bilateral renal symmetrical excretion.  Bilateral visualized proximal ureter is unremarkable.  No aortic aneurysm.  No small bowel obstruction.  No free abdominal air.  No aortic aneurysm.  Scattered transverse colon diverticula are noted.  No pericecal inflammation.  No evidence of acute diverticulitis  in  transverse colon.  Normal appendix containing contrast is clearly visualized in axial image 62.  No pericecal inflammation.  Stool noted within cecum.  Scattered left colon diverticula are noted.  No evidence of acute diverticulitis.  Multiple sigmoid colon diverticula are noted.  There is a short segment thickening of the sigmoid colon wall in the midline pelvis just  above the urinary bladder.  This is best visualized in axial image 73.  There is mild stranding of the pericolonic fat just posterior to sigmoid colon in the axial image 73 and sagittal image 72.  Findings are consistent with acute diverticulitis or a short segment focal colitis.  Moderate stool and contrast noted within the rectum.  The rectum is distended measures about 6 cm in diameter consistent with mild fecal impaction.  The urinary bladder is unremarkable.  Prostate gland and seminal vesicles are unremarkable.  Bilateral small inguinal scrotal canal hernia containing fat is noted without evidence of acute complication.  The right inguinal hernia measures 2.7 cm the left inguinal hernia containing fat measures 4.2 cm.  Surgical clips are noted bilateral upper scrotum.  IMPRESSION:  1.  Multiple sigmoid colon diverticula are noted.  Abnormal focal thickening of the sigmoid colon wall and stranding of the surrounding fat especially posteriorly in the mid pelvis proximal sigmoid colon.  Findings are consistent with mild diverticulitis or segmental colitis.  No pericolonic abscess or extraluminal contrast material. 2.  Moderate stool and contrast material noted within the moderate distended rectum. The rectum measures 6 cm in diameter suspicious for mild fecal impaction. 3.  No pericecal inflammation.  Normal appendix.  Colonic diverticula are noted transverse colon and descending colon without evidence of acute diverticulitis. 4.  Bilateral inguinal scrotal canal small hernias containing fat without evidence of acute complication.   Original Report Authenticated By: Natasha Mead, M.D.   1. Diverticulitis   2. Abdominal pain     MDM  Abdominal pain x 1 week, diagnosed with diverticulitis on CT. Worsening pain, no fever or vomiting.   Labs unremarkable. No fecal impaction. Guaiac negative. Lactate normal. CT scan shows sigmoid diverticulitis as well as stool the rectum. No evidence of abscess or  microperforation.  No vomiting in the ED. Patient's pain is controlled. He had a BM in the ED. He will continue his Cipro and Flagyl as already prescribed. He has followup tomorrow with Dr. Lovell Sheehan consultation regarding recurrent episodes of diverticulitis. Return precautions discussed.  I personally performed the services described in this documentation, which was scribed in my presence. The recorded information has been reviewed and is accurate.    James Octave, MD 04/01/13 [phone]

## 2013-04-01 NOTE — ED Notes (Signed)
Patient with no complaints at this time. Respirations even and unlabored. Skin warm/dry. Discharge instructions reviewed with patient at this time. Patient given opportunity to voice concerns/ask questions. IV removed per policy and band-aid applied to site. Patient discharged at this time and left Emergency Department with steady gait.  

## 2013-04-01 NOTE — ED Notes (Signed)
Flagyl almost completely infused. Will discharge upon completion.

## 2013-04-01 NOTE — ED Notes (Signed)
States that he started having abdominal pain about 7 days ago.  States that he had a CT scan on Thursday and was started on antibiotics on Friday.  States that he has not had a bowel movement since starting the antibiotics.  States that he has an appointment with Dr. Lovell Sheehan tomorrow.  States his antibiotics are Cipro and Flagyl.

## 2013-04-01 NOTE — ED Notes (Signed)
Per Patient, moderate amount of stool evacuated post enema. Loose with some stool balls. RN did not see stool, family had cleaned out [name] prior to RN assessment.

## 2013-04-02 NOTE — H&P (Signed)
  NTS SOAP Note  Vital Signs:  Vitals as of: 04/02/2013: Systolic 151: Diastolic 112: Heart Rate 87: Temp 98.82F: Height 96ft 11in: Weight 245Lbs 0 Ounces: Pain Level 7: BMI 34.17  BMI : 34.17 kg/m2  Subjective: This 57 Years 12 Months old Male presents for sigmoid diverticulitis.  Has had over five episodes of diverticulitis.  Currently back on flagyl and cipro due to recurrent sigmoid diverticulitis confirmed by CT scan yesterday in ER.  Denies fevers at this point.  Had TCS in 2012 by Dr. Karilyn Cota.  Stools have become smaller in caliber.  Review of Symptoms:  Constitutional:unremarkable   Head:unremarkable    Eyes:unremarkable   Nose/Mouth/Throat:unremarkable Cardiovascular:  unremarkable   Respiratory:unremarkable   Gastrointestin    abdominal pain Genitourinary:unremarkable     Musculoskeletal:unremarkable   Skin:unremarkable Hematolgic/Lymphatic:unremarkable     Allergic/Immunologic:unremarkable     Past Medical History:    Reviewed   Past Medical History  Surgical History: right shoulder surgery, right inguinal herniorrhaphy as an infant Medical Problems: none Allergies: pcn Medications: ciprofloxacin, flagyl   Social History:Reviewed  Social History  Preferred Language: English Race:  White Ethnicity: Not Hispanic / Latino Age: 57 Years 3 Months Marital Status:  S Alcohol:  No Recreational drug(s):  No   Smoking Status: Never smoker reviewed on 04/02/2013 Functional Status reviewed on mm/dd/yyyy ------------------------------------------------ Bathing: Normal Cooking: Normal Dressing: Normal Driving: Normal Eating: Normal Managing Meds: Normal Oral Care: Normal Shopping: Normal Toileting: Normal Transferring: Normal Walking: Normal Cognitive Status reviewed on mm/dd/yyyy ------------------------------------------------ Attention: Normal Decision Making: Normal Language: Normal Memory: Normal Motor:  Normal Perception: Normal Problem Solving: Normal Visual and Spatial: Normal   Family History:  Reviewed  Family Health History Family History is Unknown    Objective Information: General:  Well appearing, well nourished in no distress. Neck:  Supple without lymphadenopathy.  Heart:  RRR, no murmur Lungs:    CTA bilaterally, no wheezes, rhonchi, rales.  Breathing unlabored. Abdomen:Soft, tedner in left lower quadrant to palpation, no rigidity noted, ND, no HSM, no masses.  Assessment:Recurrant sigmoid diverticulitis  Diagnosis &amp; Procedure Smart Code   Plan:Scheduled for handassisted laparoscopic partial colectomy on 04/22/13.   Patient Education:Alternative treatments to surgery were discussed with patient (and family).  Risks and benefits  of procedure including bleeding, infection, the possibility of a blood transfusion, the possibility of an open procedure, and the possibility of a colostomy were fully explained to the patient (and family) who gave informed consent. Patient/family questions were addressed.  Will see patient again one week prior to surgery.  Continue antibiotics as prescribed.  Follow-up:Weeks 2

## 2013-04-04 ENCOUNTER — Encounter (HOSPITAL_COMMUNITY): Payer: Self-pay | Admitting: Pharmacy Technician

## 2013-04-16 NOTE — Patient Instructions (Addendum)
[name]  04/16/2013   Your procedure is scheduled on:   04/22/2013  Report to [facility] at  615  AM.  Call this number if you have problems the morning of surgery: [phone]   Remember:   Do not eat food or drink liquids after midnight.   Take these medicines the morning of surgery with A SIP OF WATER: norco   Do not wear jewelry, make-up or nail polish.  Do not wear lotions, powders, or perfumes.  Do not shave 48 hours prior to surgery. Men may shave face and neck.  Do not bring valuables to the hospital.  [facility] is not responsible for any belongings or valuables.  Contacts, dentures or bridgework may not be worn into surgery.  Leave suitcase in the car. After surgery it may be brought to your room.  For patients admitted to the hospital, checkout time is 11:00 AM the day of discharge.   Patients discharged the day of surgery will not be allowed to drive home.  Name and phone number of your driver: family  Special Instructions: Shower using CHG 2 nights before surgery and the night before surgery.  If you shower the day of surgery use CHG.  Use special wash - you have one bottle of CHG for all showers.  You should use approximately 1/3 of the bottle for each shower.   Please read over the following fact sheets that you were given: Pain Booklet, Coughing and Deep Breathing, Surgical Site Infection Prevention, Anesthesia Post-op Instructions and Care and Recovery After Surgery Laparoscopic Bowel Resection Laparoscopic bowel resection is used to remove a piece of large or small intestine that may be red, sore, or swollen (inflamed) or to remove a portion of bowel that is blocked. LET YOUR CAREGIVER KNOW ABOUT:   Allergies to food or medicine.  Medicines taken, including vitamins, herbs, eyedrops, over-the-counter medicines, and creams.  Use of steroids (by mouth or creams).  Previous problems with anesthetics or numbing medicines.  History of bleeding problems or  blood clots.  Previous surgery.  Other health problems, including diabetes and kidney problems.  Possibility of pregnancy, if this applies. RISKS AND COMPLICATIONS   Infection.  Bleeding.  Injury to other organs.  Anesthetic side effects.  Leakage from where the bowel is put back together (anastomosis).  Long delay before return of bowel function (ileus). BEFORE THE PROCEDURE   You should be present 60 minutes before your procedure or as directed by your caregiver. PROCEDURE  Laparoscopic means that the procedure is done with a thin, lighted tube (laparoscope). Once you are given medicine that makes you sleep (general anesthetic), your surgeon inflates your abdomen with carbon dioxide gas. The laparoscope is put into your abdomen through a small cut (incision). This allows your surgeon to see into the abdomen. A video camera is attached to the laparoscope to enlarge the view. The surgeon sees this image on a monitor. During the procedure, the portion of bowel to be removed is taken out through one of the incisions. The incision may need to be enlarged if the bowel is too large to be removed through one of the smaller incisions. In this case, a small incision will be made and sometimes the bowel repair is made outside the abdomen. AFTER THE PROCEDURE  If there are no problems, recovery time is brief compared to regular surgery. You will rest in a recovery room until you are stable and doing well. Following this, if  you have no other problems, you will be allowed to return to your room. Recovery time varies depending on what is found during surgery, your age, and your general health. Sometimes, it takes a few days for bowel function to return (passing gas, bowel movements). You will stay in the hospital until bowel function returns. Sometimes, a tube is placed in your stomach, through your nose (nasogastric tube). This tube is used to release pressure from your stomach (decompress) and to  decrease nausea until bowel function returns. Document Released: 02/08/2001 Document Revised: 02/14/2012 Document Reviewed: 01/04/2010 [facility] Patient Information 2014 [place], [place]. PATIENT INSTRUCTIONS POST-ANESTHESIA  IMMEDIATELY FOLLOWING SURGERY:  Do not drive or operate machinery for the first twenty four hours after surgery.  Do not make any important decisions for twenty four hours after surgery or while taking narcotic pain medications or sedatives.  If you develop intractable nausea and vomiting or a severe headache please notify your doctor immediately.  FOLLOW-UP:  Please make an appointment with your surgeon as instructed. You do not need to follow up with anesthesia unless specifically instructed to do so.  WOUND CARE INSTRUCTIONS (if applicable):  Keep a dry clean dressing on the anesthesia/puncture wound site if there is drainage.  Once the wound has quit draining you may leave it open to air.  Generally you should leave the bandage intact for twenty four hours unless there is drainage.  If the epidural site drains for more than 36-48 hours please call the anesthesia department.  QUESTIONS?:  Please feel free to call your physician or the hospital operator if you have any questions, and they will be happy to assist you.

## 2013-04-17 ENCOUNTER — Encounter (HOSPITAL_COMMUNITY)
Admission: RE | Admit: 2013-04-17 | Discharge: 2013-04-17 | Disposition: A | Discharge status: Home / Self Care | Payer: BC Managed Care – PPO | Source: Ambulatory Visit | Attending: General Surgery | Admitting: General Surgery

## 2013-04-17 ENCOUNTER — Encounter (HOSPITAL_COMMUNITY): Payer: Self-pay

## 2013-04-17 ENCOUNTER — Other Ambulatory Visit: Payer: Self-pay

## 2013-04-17 DIAGNOSIS — Z01812 Encounter for preprocedural laboratory examination: Secondary | ICD-10-CM | POA: Insufficient documentation

## 2013-04-17 DIAGNOSIS — Z01810 Encounter for preprocedural cardiovascular examination: Secondary | ICD-10-CM | POA: Insufficient documentation

## 2013-04-17 HISTORY — DX: Other complications of anesthesia, initial encounter: T88.59XA

## 2013-04-17 HISTORY — DX: Adverse effect of unspecified anesthetic, initial encounter: T41.45XA

## 2013-04-17 LAB — CBC WITH DIFFERENTIAL/PLATELET
Basophils Absolute: 0 10*3/uL (ref 0.0–0.1)
Basophils Relative: 1 % (ref 0–1)
Eosinophils Absolute: 0.3 10*3/uL (ref 0.0–0.7)
Eosinophils Relative: 5 % (ref 0–5)
HCT: 48.8 % (ref 39.0–52.0)
MCH: 30.2 pg (ref 26.0–34.0)
MCHC: 33.8 g/dL (ref 30.0–36.0)
MCV: 89.4 fL (ref 78.0–100.0)
Monocytes Absolute: 0.3 10*3/uL (ref 0.1–1.0)
Platelets: 144 10*3/uL — ABNORMAL LOW (ref 150–400)
RDW: 12.8 % (ref 11.5–15.5)
WBC: 5.8 10*3/uL (ref 4.0–10.5)

## 2013-04-17 LAB — BASIC METABOLIC PANEL
BUN: 11 mg/dL (ref 6–23)
CO2: 25 mEq/L (ref 19–32)
Calcium: 9.6 mg/dL (ref 8.4–10.5)
Chloride: 102 mEq/L (ref 96–112)
Creatinine, Ser: 0.91 mg/dL (ref 0.50–1.35)

## 2013-04-18 ENCOUNTER — Encounter (INDEPENDENT_AMBULATORY_CARE_PROVIDER_SITE_OTHER): Payer: Self-pay

## 2013-04-19 LAB — TYPE AND SCREEN: ABO/RH(D): O NEG

## 2013-04-19 MED ORDER — PHENYLEPHRINE HCL 2.5 % OP SOLN
OPHTHALMIC | Status: AC
  Filled 2013-04-19: qty 2

## 2013-04-19 MED ORDER — LIDOCAINE HCL 3.5 % OP GEL
OPHTHALMIC | Status: AC
  Filled 2013-04-19: qty 5

## 2013-04-19 MED ORDER — TETRACAINE HCL 0.5 % OP SOLN
OPHTHALMIC | Status: AC
  Filled 2013-04-19: qty 2

## 2013-04-19 MED ORDER — CYCLOPENTOLATE-PHENYLEPHRINE 0.2-1 % OP SOLN
OPHTHALMIC | Status: AC
  Filled 2013-04-19: qty 2

## 2013-04-19 MED ORDER — LIDOCAINE HCL (PF) 1 % IJ SOLN
INTRAMUSCULAR | Status: AC
  Filled 2013-04-19: qty 2

## 2013-04-19 MED ORDER — NEOMYCIN-POLYMYXIN-DEXAMETH 3.5-10000-0.1 OP OINT
TOPICAL_OINTMENT | OPHTHALMIC | Status: AC
  Filled 2013-04-19: qty 3.5

## 2013-04-22 ENCOUNTER — Inpatient Hospital Stay (HOSPITAL_COMMUNITY): Payer: BC Managed Care – PPO | Admitting: Anesthesiology

## 2013-04-22 ENCOUNTER — Encounter (HOSPITAL_COMMUNITY): Payer: Self-pay | Admitting: *Deleted

## 2013-04-22 ENCOUNTER — Encounter (HOSPITAL_COMMUNITY)
Admission: RE | Disposition: A | Discharge status: Home / Self Care | Payer: Self-pay | Source: Ambulatory Visit | Attending: General Surgery

## 2013-04-22 ENCOUNTER — Inpatient Hospital Stay (HOSPITAL_COMMUNITY)
Admission: RE | Admit: 2013-04-22 | Discharge: 2013-04-25 | DRG: 149 | Disposition: A | Discharge status: Home / Self Care | Payer: BC Managed Care – PPO | Source: Ambulatory Visit | Attending: General Surgery | Admitting: General Surgery

## 2013-04-22 ENCOUNTER — Encounter (HOSPITAL_COMMUNITY): Payer: Self-pay | Admitting: Anesthesiology

## 2013-04-22 DIAGNOSIS — K5732 Diverticulitis of large intestine without perforation or abscess without bleeding: Principal | ICD-10-CM | POA: Diagnosis present

## 2013-04-22 HISTORY — PX: COLON RESECTION: SHX5231

## 2013-04-22 SURGERY — COLECTOMY, HAND-ASSISTED, LAPAROSCOPIC
Anesthesia: General | Site: Abdomen | Wound class: Clean Contaminated

## 2013-04-22 MED ORDER — ROCURONIUM BROMIDE 100 MG/10ML IV SOLN
INTRAVENOUS | Status: DC | PRN
  Administered 2013-04-22: 8 mg via INTRAVENOUS
  Administered 2013-04-22 (×2): 10 mg via INTRAVENOUS
  Administered 2013-04-22: 42 mg via INTRAVENOUS

## 2013-04-22 MED ORDER — ONDANSETRON HCL 4 MG/2ML IJ SOLN
INTRAMUSCULAR | Status: AC
  Filled 2013-04-22: qty 2

## 2013-04-22 MED ORDER — LACTATED RINGERS IV SOLN
INTRAVENOUS | Status: DC
  Administered 2013-04-22 – 2013-04-24 (×5): via INTRAVENOUS

## 2013-04-22 MED ORDER — ENOXAPARIN SODIUM 40 MG/0.4ML ~~LOC~~ SOLN
40.0000 mg | Freq: Once | SUBCUTANEOUS | Status: AC
  Administered 2013-04-22: 40 mg via SUBCUTANEOUS

## 2013-04-22 MED ORDER — DEXAMETHASONE SODIUM PHOSPHATE 4 MG/ML IJ SOLN
INTRAMUSCULAR | Status: AC
  Filled 2013-04-22: qty 1

## 2013-04-22 MED ORDER — OXYCODONE-ACETAMINOPHEN 5-325 MG PO TABS
1.0000 | ORAL_TABLET | ORAL | Status: DC | PRN
  Administered 2013-04-22 – 2013-04-23 (×5): 2 via ORAL
  Administered 2013-04-24: 1 via ORAL
  Administered 2013-04-24 – 2013-04-25 (×4): 2 via ORAL
  Filled 2013-04-22 (×8): qty 2
  Filled 2013-04-22: qty 1
  Filled 2013-04-22: qty 2

## 2013-04-22 MED ORDER — CHLORHEXIDINE GLUCONATE 4 % EX LIQD: 1.0000 "application " | Freq: Once | CUTANEOUS | Status: DC

## 2013-04-22 MED ORDER — HYDROMORPHONE HCL PF 1 MG/ML IJ SOLN
1.0000 mg | INTRAMUSCULAR | Status: DC | PRN
  Administered 2013-04-22 – 2013-04-24 (×14): 1 mg via INTRAVENOUS
  Filled 2013-04-22 (×14): qty 1

## 2013-04-22 MED ORDER — BUPIVACAINE HCL (PF) 0.5 % IJ SOLN
INTRAMUSCULAR | Status: AC
  Filled 2013-04-22: qty 30

## 2013-04-22 MED ORDER — BUPIVACAINE HCL (PF) 0.5 % IJ SOLN
INTRAMUSCULAR | Status: DC | PRN
  Administered 2013-04-22: 10 mL

## 2013-04-22 MED ORDER — FENTANYL CITRATE 0.05 MG/ML IJ SOLN
INTRAMUSCULAR | Status: AC
  Filled 2013-04-22: qty 2

## 2013-04-22 MED ORDER — ONDANSETRON HCL 4 MG/2ML IJ SOLN
4.0000 mg | Freq: Once | INTRAMUSCULAR | Status: AC
Start: 2013-04-22 — End: 2013-04-22
  Administered 2013-04-22: 4 mg via INTRAVENOUS

## 2013-04-22 MED ORDER — SODIUM CHLORIDE 0.9 % IR SOLN
Status: DC | PRN
  Administered 2013-04-22 (×2): 1000 mL

## 2013-04-22 MED ORDER — POVIDONE-IODINE 10 % OINT PACKET: TOPICAL_OINTMENT | CUTANEOUS | Status: DC | PRN

## 2013-04-22 MED ORDER — KETOROLAC TROMETHAMINE 30 MG/ML IJ SOLN
30.0000 mg | Freq: Once | INTRAMUSCULAR | Status: AC
  Administered 2013-04-22: 30 mg via INTRAVENOUS

## 2013-04-22 MED ORDER — DEXAMETHASONE SODIUM PHOSPHATE 4 MG/ML IJ SOLN
4.0000 mg | Freq: Once | INTRAMUSCULAR | Status: AC
  Administered 2013-04-22: 4 mg via INTRAVENOUS

## 2013-04-22 MED ORDER — LIDOCAINE HCL (PF) 1 % IJ SOLN
INTRAMUSCULAR | Status: AC
  Filled 2013-04-22: qty 5

## 2013-04-22 MED ORDER — ENOXAPARIN SODIUM 40 MG/0.4ML ~~LOC~~ SOLN
40.0000 mg | SUBCUTANEOUS | Status: DC
  Administered 2013-04-23 – 2013-04-24 (×2): 40 mg via SUBCUTANEOUS
  Filled 2013-04-22 (×3): qty 0.4

## 2013-04-22 MED ORDER — PANTOPRAZOLE SODIUM 40 MG PO TBEC
40.0000 mg | DELAYED_RELEASE_TABLET | Freq: Every day | ORAL | Status: DC
  Administered 2013-04-22 – 2013-04-24 (×3): 40 mg via ORAL
  Filled 2013-04-22 (×3): qty 1

## 2013-04-22 MED ORDER — ONDANSETRON HCL 4 MG PO TABS: 4.0000 mg | ORAL_TABLET | Freq: Four times a day (QID) | ORAL | Status: DC | PRN

## 2013-04-22 MED ORDER — ENOXAPARIN SODIUM 40 MG/0.4ML ~~LOC~~ SOLN
SUBCUTANEOUS | Status: AC
  Filled 2013-04-22: qty 0.4

## 2013-04-22 MED ORDER — DIPHENHYDRAMINE HCL 50 MG/ML IJ SOLN
25.0000 mg | Freq: Four times a day (QID) | INTRAMUSCULAR | Status: DC | PRN
  Administered 2013-04-22 – 2013-04-25 (×9): 25 mg via INTRAVENOUS
  Filled 2013-04-22 (×11): qty 1

## 2013-04-22 MED ORDER — ROCURONIUM BROMIDE 50 MG/5ML IV SOLN
INTRAVENOUS | Status: AC
  Filled 2013-04-22: qty 1

## 2013-04-22 MED ORDER — CIPROFLOXACIN IN D5W 400 MG/200ML IV SOLN
INTRAVENOUS | Status: AC
  Filled 2013-04-22: qty 200

## 2013-04-22 MED ORDER — LORAZEPAM 2 MG/ML IJ SOLN
1.0000 mg | INTRAMUSCULAR | Status: DC | PRN
  Administered 2013-04-22 – 2013-04-23 (×3): 1 mg via INTRAVENOUS
  Filled 2013-04-22 (×3): qty 1

## 2013-04-22 MED ORDER — FENTANYL CITRATE 0.05 MG/ML IJ SOLN
INTRAMUSCULAR | Status: AC
  Filled 2013-04-22: qty 5

## 2013-04-22 MED ORDER — FENTANYL CITRATE 0.05 MG/ML IJ SOLN
INTRAMUSCULAR | Status: DC | PRN
  Administered 2013-04-22 (×15): 50 ug via INTRAVENOUS

## 2013-04-22 MED ORDER — ALVIMOPAN 12 MG PO CAPS
ORAL_CAPSULE | ORAL | Status: AC
  Filled 2013-04-22: qty 1

## 2013-04-22 MED ORDER — GLYCOPYRROLATE 0.2 MG/ML IJ SOLN
INTRAMUSCULAR | Status: DC | PRN
  Administered 2013-04-22: .7 mg via INTRAVENOUS

## 2013-04-22 MED ORDER — KETOROLAC TROMETHAMINE 30 MG/ML IJ SOLN
15.0000 mg | Freq: Four times a day (QID) | INTRAMUSCULAR | Status: DC | PRN
  Filled 2013-04-22: qty 1

## 2013-04-22 MED ORDER — ALVIMOPAN 12 MG PO CAPS
12.0000 mg | ORAL_CAPSULE | Freq: Once | ORAL | Status: AC
  Administered 2013-04-22: 12 mg via ORAL

## 2013-04-22 MED ORDER — ONDANSETRON HCL 4 MG/2ML IJ SOLN: 4.0000 mg | Freq: Once | INTRAMUSCULAR | Status: DC | PRN

## 2013-04-22 MED ORDER — METRONIDAZOLE IN NACL 5-0.79 MG/ML-% IV SOLN
500.0000 mg | INTRAVENOUS | Status: AC
  Administered 2013-04-22: 500 mg via INTRAVENOUS

## 2013-04-22 MED ORDER — NEOSTIGMINE METHYLSULFATE 1 MG/ML IJ SOLN
INTRAMUSCULAR | Status: DC | PRN
Start: 2013-04-22 — End: 2013-04-22
  Administered 2013-04-22: 4 mg via INTRAVENOUS

## 2013-04-22 MED ORDER — ONDANSETRON HCL 4 MG/2ML IJ SOLN: 4.0000 mg | Freq: Four times a day (QID) | INTRAMUSCULAR | Status: DC | PRN

## 2013-04-22 MED ORDER — PROPOFOL 10 MG/ML IV EMUL
INTRAVENOUS | Status: AC
  Filled 2013-04-22: qty 20

## 2013-04-22 MED ORDER — EPHEDRINE SULFATE 50 MG/ML IJ SOLN
INTRAMUSCULAR | Status: DC | PRN
  Administered 2013-04-22 (×3): 10 mg via INTRAVENOUS

## 2013-04-22 MED ORDER — MIDAZOLAM HCL 2 MG/2ML IJ SOLN
INTRAMUSCULAR | Status: AC
  Filled 2013-04-22: qty 2

## 2013-04-22 MED ORDER — SUCCINYLCHOLINE CHLORIDE 20 MG/ML IJ SOLN
INTRAMUSCULAR | Status: AC
  Filled 2013-04-22: qty 1

## 2013-04-22 MED ORDER — POVIDONE-IODINE 10 % EX OINT
TOPICAL_OINTMENT | CUTANEOUS | Status: AC
  Filled 2013-04-22: qty 2

## 2013-04-22 MED ORDER — METRONIDAZOLE IN NACL 5-0.79 MG/ML-% IV SOLN
INTRAVENOUS | Status: AC
  Filled 2013-04-22: qty 100

## 2013-04-22 MED ORDER — CIPROFLOXACIN IN D5W 400 MG/200ML IV SOLN
400.0000 mg | INTRAVENOUS | Status: AC
  Administered 2013-04-22: 400 mg via INTRAVENOUS

## 2013-04-22 MED ORDER — KETOROLAC TROMETHAMINE 30 MG/ML IJ SOLN
INTRAMUSCULAR | Status: AC
  Filled 2013-04-22: qty 1

## 2013-04-22 MED ORDER — ALUM & MAG HYDROXIDE-SIMETH 200-200-20 MG/5ML PO SUSP: 30.0000 mL | ORAL | Status: DC | PRN

## 2013-04-22 MED ORDER — FENTANYL CITRATE 0.05 MG/ML IJ SOLN
25.0000 ug | INTRAMUSCULAR | Status: DC | PRN
  Administered 2013-04-22: 50 ug via INTRAVENOUS
  Administered 2013-04-22 (×2): 25 ug via INTRAVENOUS
  Administered 2013-04-22: 50 ug via INTRAVENOUS

## 2013-04-22 MED ORDER — GLYCOPYRROLATE 0.2 MG/ML IJ SOLN
INTRAMUSCULAR | Status: AC
  Filled 2013-04-22: qty 3

## 2013-04-22 MED ORDER — MIDAZOLAM HCL 2 MG/2ML IJ SOLN
1.0000 mg | INTRAMUSCULAR | Status: DC | PRN
  Administered 2013-04-22 (×2): 2 mg via INTRAVENOUS

## 2013-04-22 MED ORDER — ALVIMOPAN 12 MG PO CAPS
12.0000 mg | ORAL_CAPSULE | Freq: Two times a day (BID) | ORAL | Status: DC
  Administered 2013-04-23 – 2013-04-25 (×5): 12 mg via ORAL
  Filled 2013-04-22 (×5): qty 1

## 2013-04-22 MED ORDER — LACTATED RINGERS IV SOLN
INTRAVENOUS | Status: DC
  Administered 2013-04-22 (×2): via INTRAVENOUS
  Administered 2013-04-22: 1000 mL via INTRAVENOUS

## 2013-04-22 MED ORDER — LIDOCAINE HCL (CARDIAC) 20 MG/ML IV SOLN
INTRAVENOUS | Status: DC | PRN
  Administered 2013-04-22: 50 mg via INTRAVENOUS

## 2013-04-22 SURGICAL SUPPLY — 76 items
BAG HAMPER (MISCELLANEOUS) ×2 IMPLANT
BLADE SURG SZ10 CARB STEEL (BLADE) ×2 IMPLANT
CHLORAPREP W/TINT 26ML (MISCELLANEOUS) ×2 IMPLANT
CLOTH BEACON ORANGE TIMEOUT ST (SAFETY) ×2 IMPLANT
COVER LIGHT HANDLE STERIS (MISCELLANEOUS) ×4 IMPLANT
COVER MAYO STAND XLG (DRAPE) ×1 IMPLANT
CUTTER ENDO LINEAR 45M (STAPLE) IMPLANT
DECANTER SPIKE VIAL GLASS SM (MISCELLANEOUS) IMPLANT
DRAPE INCISE IOBAN 44X35 STRL (DRAPES) ×2 IMPLANT
DRAPE WARM FLUID 44X44 (DRAPE) ×2 IMPLANT
DRSG OPSITE POSTOP 4X8 (GAUZE/BANDAGES/DRESSINGS) ×2 IMPLANT
DURAPREP 26ML APPLICATOR (WOUND CARE) ×2 IMPLANT
ELECT BLADE 6 FLAT ULTRCLN (ELECTRODE) IMPLANT
ELECT REM PT RETURN 9FT ADLT (ELECTROSURGICAL) ×2
ELECTRODE REM PT RTRN 9FT ADLT (ELECTROSURGICAL) ×1 IMPLANT
FILTER SMOKE EVAC LAPAROSHD (FILTER) ×2 IMPLANT
GLOVE BIO SURGEON STRL SZ7.5 (GLOVE) ×2 IMPLANT
GLOVE BIOGEL PI IND STRL 7.0 (GLOVE) IMPLANT
GLOVE BIOGEL PI IND STRL 7.5 (GLOVE) IMPLANT
GLOVE BIOGEL PI INDICATOR 7.0 (GLOVE) ×1
GLOVE BIOGEL PI INDICATOR 7.5 (GLOVE) ×1
GLOVE ECLIPSE 6.5 STRL STRAW (GLOVE) ×1 IMPLANT
GLOVE ECLIPSE 7.0 STRL STRAW (GLOVE) ×1 IMPLANT
GLOVE EXAM NITRILE MD LF STRL (GLOVE) ×2 IMPLANT
GOWN STRL REIN XL XLG (GOWN DISPOSABLE) ×9 IMPLANT
INST SET LAPROSCOPIC AP (KITS) ×2 IMPLANT
INST SET MAJOR GENERAL (KITS) ×2 IMPLANT
IV NS IRRIG 3000ML ARTHROMATIC (IV SOLUTION) IMPLANT
KIT ROOM TURNOVER AP CYSTO (KITS) IMPLANT
LIGASURE 5MM LAPAROSCOPIC (INSTRUMENTS) IMPLANT
LIGASURE LAP ATLAS 10MM 37CM (INSTRUMENTS) IMPLANT
MANIFOLD NEPTUNE II (INSTRUMENTS) ×2 IMPLANT
NDL HYPO 18GX1.5 BLUNT FILL (NEEDLE) IMPLANT
NEEDLE HYPO 18GX1.5 BLUNT FILL (NEEDLE) IMPLANT
NS IRRIG 1000ML POUR BTL (IV SOLUTION) ×3 IMPLANT
PACK LAP CHOLE LZT030E (CUSTOM PROCEDURE TRAY) ×2 IMPLANT
PAD ARMBOARD 7.5X6 YLW CONV (MISCELLANEOUS) ×2 IMPLANT
PENCIL HANDSWITCHING (ELECTRODE) ×2 IMPLANT
RELOAD LINEAR CUT PROX 55 BLUE (ENDOMECHANICALS) IMPLANT
RELOAD PROXIMATE 75MM BLUE (ENDOMECHANICALS) ×4 IMPLANT
RELOAD STAPLE 45 3.5 BLU ETS (ENDOMECHANICALS) IMPLANT
RELOAD STAPLE 55 3.8 BLU REG (ENDOMECHANICALS) IMPLANT
RELOAD STAPLE 75 3.8 BLU REG (ENDOMECHANICALS) IMPLANT
RELOAD STAPLE TA45 3.5 REG BLU (ENDOMECHANICALS) IMPLANT
SEALER TISSUE G2 CVD JAW 35 (ENDOMECHANICALS) IMPLANT
SEALER TISSUE G2 CVD JAW 45CM (ENDOMECHANICALS)
SET BASIN LINEN APH (SET/KITS/TRAYS/PACK) ×2 IMPLANT
SET TUBE IRRIG SUCTION NO TIP (IRRIGATION / IRRIGATOR) IMPLANT
SHEET LAVH (DRAPES) IMPLANT
SPONGE GAUZE 2X2 8PLY STRL LF (GAUZE/BANDAGES/DRESSINGS) ×4 IMPLANT
SPONGE GAUZE 4X4 12PLY (GAUZE/BANDAGES/DRESSINGS) ×2 IMPLANT
SPONGE LAP 18X18 X RAY DECT (DISPOSABLE) ×4 IMPLANT
STAPLER GUN LINEAR PROX 60 (STAPLE) ×3 IMPLANT
STAPLER PROXIMATE 55 BLUE (STAPLE) IMPLANT
STAPLER PROXIMATE 75MM BLUE (STAPLE) ×1 IMPLANT
STAPLER VISISTAT (STAPLE) ×2 IMPLANT
SUCTION POOLE TIP (SUCTIONS) ×2 IMPLANT
SUT CHROMIC 0 CT 1 (SUTURE) ×2 IMPLANT
SUT CHROMIC 2 0 SH (SUTURE) IMPLANT
SUT PDS AB CT VIOLET #0 27IN (SUTURE) IMPLANT
SUT SILK 2 0 (SUTURE)
SUT SILK 2-0 18XBRD TIE 12 (SUTURE) IMPLANT
SUT SILK 3 0 SH CR/8 (SUTURE) ×2 IMPLANT
SUT VIC AB 0 CT1 27 (SUTURE)
SUT VIC AB 0 CT1 27XCR 8 STRN (SUTURE) IMPLANT
SUT VIC AB 2-0 CT2 27 (SUTURE) IMPLANT
SUT VICRYL 0 UR6 27IN ABS (SUTURE) ×2 IMPLANT
SYS LAPSCP GELPORT 120MM (MISCELLANEOUS) ×2
SYSTEM LAPSCP GELPORT 120MM (MISCELLANEOUS) ×1 IMPLANT
TAPE CLOTH SURG 4X10 WHT LF (GAUZE/BANDAGES/DRESSINGS) ×1 IMPLANT
TRAY FOLEY CATH 16FR SILVER (SET/KITS/TRAYS/PACK) ×2 IMPLANT
TROCAR Z-THRD FIOS HNDL 11X100 (TROCAR) ×2 IMPLANT
TROCAR Z-THREAD SLEEVE 11X100 (TROCAR) ×2 IMPLANT
TUBING INSUF HEATED (TUBING) ×2 IMPLANT
WARMER LAPAROSCOPE (MISCELLANEOUS) ×2 IMPLANT
YANKAUER SUCT BULB TIP 10FT TU (MISCELLANEOUS) ×2 IMPLANT

## 2013-04-22 NOTE — Transfer of Care (Signed)
Immediate Anesthesia Transfer of Care Note  Patient: James Simpson  Procedure(s) Performed: Procedure(s): LAPAROSCOPIC HAND ASSISTED PARTIAL COLECTOMY  (N/A)  Patient Location: PACU  Anesthesia Type:General  Level of Consciousness: awake and alert   Airway & Oxygen Therapy: Patient Spontanous Breathing and Patient connected to face mask oxygen  Post-op Assessment: Report given to PACU RN  Post vital signs: Reviewed and stable  Complications: No apparent anesthesia complications

## 2013-04-22 NOTE — Anesthesia Procedure Notes (Signed)
Procedure Name: Intubation Date/Time: 04/22/2013 7:46 AM Performed by: [name] E Pre-anesthesia Checklist: Patient identified, Patient being monitored, Timeout performed, Emergency Drugs available and Suction available Patient Re-evaluated:Patient Re-evaluated prior to inductionOxygen Delivery Method: Circle System Utilized Preoxygenation: Pre-oxygenation with 100% oxygen Intubation Type: IV induction Ventilation: Mask ventilation without difficulty Laryngoscope Size: Mac and 3 Grade View: Grade II Tube type: Oral Tube size: 8.0 mm Number of attempts: 1 Airway Equipment and Method: stylet Placement Confirmation: ETT inserted through vocal cords under direct vision,  positive ETCO2 and breath sounds checked- equal and bilateral Secured at: 21 cm Tube secured with: Tape Dental Injury: Teeth and Oropharynx as per pre-operative assessment

## 2013-04-22 NOTE — Progress Notes (Signed)
Eyeglasses and clothing given to wife. Wife will keep.

## 2013-04-22 NOTE — Op Note (Signed)
Patient:  James Simpson  DOB:  1955/09/07  MRN:  [id number]   Preop Diagnosis:  Sigmoid diverticulitis  Postop Diagnosis:  Same  Procedure:  Laparoscopic hand-assisted partial colectomy  Surgeon:  Franky Macho, M.D.  Anes:  General endotracheal  Indications:  Patient is a 57 year old white male presents with recurrent episodes of sigmoid diverticulitis. He now presents for laparoscopic hand-assisted partial colectomy. The risks and benefits of the procedure including bleeding, infection, the possible need for blood transfusion, and the possibility of an open procedure were fully explained to the patient, who gave informed consent.  Procedure note:  The patient was placed in the lithotomy position. After induction of general endotracheal anesthesia, the abdomen and perineum were prepped and draped using usual sterile technique with chlor prep. Surgical site confirmation was performed.  A midline incision was made just above the pubic region. The peritoneal cavity was entered into without difficulty. A GelPort was then inserted and the abdomen was insufflated to 16 mm mercury pressure. An 11 mm trocar was placed left lower quadrant region and in 11 millimeter trocar was placed in the right upper flank region. The sigmoid colon was noted to be adhesed to the anterior and side wall. These adhesions were sharply dissected using the LigaSure. The descending colon was mobilized along the peritoneal reflection using Endo Shears and the LigaSure. The splenic flexure was taken down using the LigaSure. The mesentery of the sigmoid colon was then mobilized along its peritoneal reflection down to the rectum. The left ureter was identified and kept from the operative field. The sigmoid colon was then exteriorized. A GIA stapler was placed proximally distally across the sigmoid colon and fired. The mesentery was then divided using the LigaSure. The specimen was removed from the operative field and sent to  pathology further examination. A side to side descending colon to colorectal juncture anastomosis was then performed using a GIA 75 stapler. The colon he was closed using a TA 60 stapler. The staple line was bolstered using 3-0 silk sutures. Surrounding adipose tissue was then used to cover the anastomotic line. No abnormal bleeding was noted from the anastomosis. The anastomosis was inspected and noted to be widely patent without apparent disruption. All operating personnel then changed their gowns and gloves and a new set up was initiated. The abdomen was copious irrigated normal saline. The abdomen was then reinsufflated and the anastomosis as well as the descending colon dissection was without bleeding. All fluid and air were then evacuated from the abdominal cavity prior to removal of the GelPort and the trochars.  All wounds were irrigated with normal saline. All wounds were injected with 0.5% Sensorcaine. The lower midline peritoneum was closed using a running 0 chromic gut suture. The fascia was reapproximated using an 0 Vicryl running suture. All incisions were closed using staples. Betadine ointment and dry sterile dressings were applied.  All tape and needle counts were correct at the end of the procedure. The patient was extubated in the operating room and transferred to PACU in stable condition.  Complications:  None  EBL:  100 cc  Specimen:  Sigmoid colon

## 2013-04-22 NOTE — Anesthesia Postprocedure Evaluation (Signed)
  Anesthesia Post-op Note  Patient: James Simpson  Procedure(s) Performed: Procedure(s): LAPAROSCOPIC HAND ASSISTED PARTIAL COLECTOMY  (N/A)  Patient Location: PACU  Anesthesia Type:General  Level of Consciousness: awake and alert   Airway and Oxygen Therapy: Patient Spontanous Breathing and Patient connected to face mask oxygen  Post-op Pain: moderate  Post-op Assessment: Post-op Vital signs reviewed, Patient's Cardiovascular Status Stable, Respiratory Function Stable, Patent Airway and No signs of Nausea or vomiting  Post-op Vital Signs: Reviewed and stable  Complications: No apparent anesthesia complications

## 2013-04-22 NOTE — Progress Notes (Signed)
UR chart review completed.  

## 2013-04-22 NOTE — Anesthesia Preprocedure Evaluation (Signed)
Anesthesia Evaluation  Patient identified by MRN, date of birth, ID band Patient awake    Reviewed: Allergy & Precautions, H&P , NPO status , Patient's Chart, lab work & pertinent test results  History of Anesthesia Complications (+) Emergence Delirium  Airway Mallampati: II TM Distance: >3 FB Neck ROM: Full    Dental  (+) Teeth Intact, Caps and Dental Advisory Given,    Pulmonary neg pulmonary ROS,  breath sounds clear to auscultation        Cardiovascular negative cardio ROS  Rhythm:Regular Rate:Normal     Neuro/Psych    GI/Hepatic negative GI ROS,   Endo/Other    Renal/GU      Musculoskeletal   Abdominal   Peds  Hematology   Anesthesia Other Findings   Reproductive/Obstetrics                           Anesthesia Physical Anesthesia Plan  ASA: I  Anesthesia Plan: General   Post-op Pain Management:    Induction: Intravenous  Airway Management Planned: Oral ETT  Additional Equipment:   Intra-op Plan:   Post-operative Plan: Extubation in OR  Informed Consent: I have reviewed the patients History and Physical, chart, labs and discussed the procedure including the risks, benefits and alternatives for the proposed anesthesia with the patient or authorized representative who has indicated his/her understanding and acceptance.     Plan Discussed with:   Anesthesia Plan Comments:         Anesthesia Quick Evaluation

## 2013-04-22 NOTE — Interval H&P Note (Signed)
History and Physical Interval Note:  04/22/2013 7:18 AM  James Simpson  has presented today for surgery, with the diagnosis of diverticulitis  The various methods of treatment have been discussed with the patient and family. After consideration of risks, benefits and other options for treatment, the patient has consented to  Procedure(s): HAND ASSISTED LAPAROSCOPIC PARTIAL COLECTOMY  (N/A) as a surgical intervention .  The patient's history has been reviewed, patient examined, no change in status, stable for surgery.  I have reviewed the patient's chart and labs.  Questions were answered to the patient's satisfaction.     Franky Macho A

## 2013-04-23 LAB — BASIC METABOLIC PANEL
GFR calc Af Amer: 75 mL/min — ABNORMAL LOW (ref >90)
GFR calc non Af Amer: 65 mL/min — ABNORMAL LOW (ref >90)
Glucose, Bld: 112 mg/dL — ABNORMAL HIGH (ref 70–99)
Potassium: 4.5 mEq/L (ref 3.5–5.1)
Sodium: 135 mEq/L (ref 135–145)

## 2013-04-23 LAB — PHOSPHORUS: Phosphorus: 5.2 mg/dL — ABNORMAL HIGH (ref 2.3–4.6)

## 2013-04-23 LAB — CBC
HCT: 44.9 % (ref 39.0–52.0)
Platelets: 138 10*3/uL — ABNORMAL LOW (ref 150–400)
RDW: 13.5 % (ref 11.5–15.5)
WBC: 10 10*3/uL (ref 4.0–10.5)

## 2013-04-23 LAB — MAGNESIUM: Magnesium: 2.1 mg/dL (ref 1.5–2.5)

## 2013-04-23 MED ORDER — MENTHOL 3 MG MT LOZG
1.0000 | LOZENGE | OROMUCOSAL | Status: DC | PRN
  Administered 2013-04-23 – 2013-04-25 (×2): 3 mg via ORAL
  Filled 2013-04-23 (×2): qty 9

## 2013-04-23 NOTE — Care Management Note (Signed)
    Page 1 of 1   04/25/2013     9:30:52 AM   CARE MANAGEMENT NOTE 04/25/2013  Patient:  James Simpson, James Simpson   Account Number:  [id number]  Date Initiated:  04/23/2013  Documentation initiated by:  Sharrie Rothman  Subjective/Objective Assessment:   Pt admitted from home s/p partial colectomy. Pt lives with his wife and will return home at discharge. Pt is independent with ADL's.     Action/Plan:   No CM needs noted.   Anticipated DC Date:  04/26/2013   Anticipated DC Plan:  HOME/SELF CARE      DC Planning Services  CM consult      Choice offered to / List presented to:             Status of service:  Completed, signed off Medicare Important Message given?   (If response is "NO", the following Medicare IM given date fields will be blank) Date Medicare IM given:   Date Additional Medicare IM given:    Discharge Disposition:  HOME/SELF CARE  Per UR Regulation:    If discussed at Long Length of Stay Meetings, dates discussed:    Comments:  04/25/13 0930 Braeden Kennan Amie Critchley, RN BSN CM Pt discharged home today. No CM needs noted.  04/23/13 1130 Arlyss Queen, RN BSN CM

## 2013-04-23 NOTE — Progress Notes (Signed)
Patient has refused to walk several times today.  The patient was told this morning that they would need to get up and sit in the chair.  Maryann, RN explained the benefits of getting out of bed and attempted to get patient up.  Patient refused.  Patient has been asked three more times since Maryann's attempt and patient refused.  Patient states, "I am in too much pain.  It is not going to happen today."  Patient has been given prescribed pain medication throughout the day.

## 2013-04-23 NOTE — Progress Notes (Signed)
1 Day Post-Op  Subjective: Mild incisional pain. States he might have had a small amount of flatus.  Objective: Vital signs in last 24 hours: Temp:  [97.1 F (36.2 C)-98.6 F (37 C)] 97.7 F (36.5 C) (08/26 0533) Pulse Rate:  [66-104] 69 (08/26 0533) Resp:  [9-20] 16 (08/26 0533) BP: (98-137)/(37-79) 106/37 mmHg (08/26 0533) SpO2:  [90 %-98 %] 94 % (08/26 0533) Last BM Date: 04/22/13  Intake/Output from previous day: 08/25 0701 - 08/26 0700 In: 4230 [P.O.:360; I.V.:3870] Out: 860 [Urine:760; Blood:100] Intake/Output this shift:    General appearance: alert, cooperative and no distress Resp: clear to auscultation bilaterally Cardio: regular rate and rhythm, S1, S2 normal, no murmur, click, rub or gallop GI: Soft. Dressings dry and intact. No bowel sounds appreciated.  Lab Results:   Recent Labs  04/23/13 0504  WBC 10.0  HGB 14.9  HCT 44.9  PLT 138*   BMET  Recent Labs  04/23/13 0504  NA 135  K 4.5  CL 98  CO2 29  GLUCOSE 112*  BUN 17  CREATININE 1.22  CALCIUM 8.6   PT/INR No results found for this basename: LABPROT, INR,  in the last 72 hours  Studies/Results: No results found.  Anti-infectives: Anti-infectives   Start     Dose/Rate Route Frequency Ordered Stop   04/22/13 0615  metroNIDAZOLE (FLAGYL) IVPB 500 mg     500 mg 100 mL/hr over 60 Minutes Intravenous On call to O.R. 04/22/13 0615 04/22/13 0735   04/22/13 0615  ciprofloxacin (CIPRO) IVPB 400 mg     400 mg 200 mL/hr over 60 Minutes Intravenous On call to O.R. 04/22/13 0615 04/22/13 0810      Assessment/Plan: s/p Procedure(s): LAPAROSCOPIC HAND ASSISTED PARTIAL COLECTOMY  Impression: Stable postoperative day one. We'll advance to full liquid diet. Will get patient to ambulate. Awaiting full return of bowel function.  LOS: 1 day    Dequan Kindred A 04/23/2013

## 2013-04-23 NOTE — Progress Notes (Signed)
Patient out of bed to chair for one hour this pm.

## 2013-04-24 ENCOUNTER — Encounter (HOSPITAL_COMMUNITY): Payer: Self-pay | Admitting: General Surgery

## 2013-04-24 LAB — BASIC METABOLIC PANEL
BUN: 11 mg/dL (ref 6–23)
CO2: 28 mEq/L (ref 19–32)
Chloride: 99 mEq/L (ref 96–112)
Creatinine, Ser: 1 mg/dL (ref 0.50–1.35)
Glucose, Bld: 92 mg/dL (ref 70–99)
Potassium: 4 mEq/L (ref 3.5–5.1)

## 2013-04-24 LAB — CBC
HCT: 42.2 % (ref 39.0–52.0)
Hemoglobin: 14.2 g/dL (ref 13.0–17.0)
MCV: 91.7 fL (ref 78.0–100.0)
WBC: 8.2 10*3/uL (ref 4.0–10.5)

## 2013-04-24 NOTE — Progress Notes (Signed)
2 Days Post-Op  Subjective: Moderate incisional pain. No bowel movement yet. Foley has been removed.  Objective: Vital signs in last 24 hours: Temp:  [97.9 F (36.6 C)-98.3 F (36.8 C)] 98.3 F (36.8 C) (08/27 0619) Pulse Rate:  [70-73] 73 (08/27 0619) Resp:  [18-20] 20 (08/27 0619) BP: (119-131)/(68-78) 131/68 mmHg (08/27 0619) SpO2:  [92 %-97 %] 94 % (08/27 0619) Last BM Date: 04/22/13  Intake/Output from previous day: 08/26 0701 - 08/27 0700 In: 2160 [P.O.:1560; I.V.:600] Out: 2450 [Urine:2450] Intake/Output this shift: Total I/O In: 240 [P.O.:240] Out: 800 [Urine:800]  General appearance: alert, cooperative and no distress Resp: clear to auscultation bilaterally Cardio: regular rate and rhythm, S1, S2 normal, no murmur, click, rub or gallop GI: Soft. Dressings dry and intact. Bowel sounds appreciated.  Lab Results:   Recent Labs  04/23/13 0504 04/24/13 0536  WBC 10.0 8.2  HGB 14.9 14.2  HCT 44.9 42.2  PLT 138* 127*   BMET  Recent Labs  04/23/13 0504 04/24/13 0536  NA 135 135  K 4.5 4.0  CL 98 99  CO2 29 28  GLUCOSE 112* 92  BUN 17 11  CREATININE 1.22 1.00  CALCIUM 8.6 9.0   PT/INR No results found for this basename: LABPROT, INR,  in the last 72 hours  Studies/Results: No results found.  Anti-infectives: Anti-infectives   Start     Dose/Rate Route Frequency Ordered Stop   04/22/13 0615  metroNIDAZOLE (FLAGYL) IVPB 500 mg     500 mg 100 mL/hr over 60 Minutes Intravenous On call to O.R. 04/22/13 0615 04/22/13 0735   04/22/13 0615  ciprofloxacin (CIPRO) IVPB 400 mg     400 mg 200 mL/hr over 60 Minutes Intravenous On call to O.R. 04/22/13 0615 04/22/13 0810      Assessment/Plan: s/p Procedure(s): LAPAROSCOPIC HAND ASSISTED PARTIAL COLECTOMY  Impression: Postoperative day 2. Recovering well. Awaiting full return of bowel function. Have encouraged patient to ambulate.  LOS: 2 days    James Simpson A 04/24/2013

## 2013-04-25 MED ORDER — OXYCODONE-ACETAMINOPHEN 7.5-325 MG PO TABS: 1.0000 | ORAL_TABLET | ORAL | Status: DC | PRN

## 2013-04-25 NOTE — Progress Notes (Signed)
Patient given d/c prescriptions and instructions. IV cath removed and intact. No pain/swelling at site. Verbalizes understanding. Awaiting transport.

## 2013-04-25 NOTE — Discharge Summary (Signed)
Physician Discharge Summary  Patient ID: EPHRIAM TURMAN MRN: [id number] DOB/AGE: 1956/02/26 57 y.o.  Admit date: 04/22/2013 Discharge date: 04/25/2013  Admission Diagnoses: Sigmoid diverticulitis  Discharge Diagnoses: Same Active Problems:   * No active hospital problems. *   Discharged Condition: good  Hospital Course: Patient is a 57 year old white male who presented for an elective laparoscopic hand-assisted sigmoid colectomy on 04/22/2013. He tolerated the procedure well. His postoperative course has been unremarkable. His diet has been advanced as tolerated. He has active bowel sounds and tolerating full liquid diet well. Final pathology is negative for malignancy. He is being discharged home in good improving condition.  Treatments: surgery: Laparoscopic hand-assisted partial colectomy on 04/22/2013  Discharge Exam: Blood pressure 142/98, pulse 76, temperature 98.1 F (36.7 C), temperature source Oral, resp. rate 20, height 5\' 11"  (1.803 m), weight 111.131 kg (245 lb), SpO2 96.00%. General appearance: alert, cooperative and no distress Resp: clear to auscultation bilaterally Cardio: regular rate and rhythm, S1, S2 normal, no murmur, click, rub or gallop GI: Soft. Incisions healing well. Active bowel sounds appreciated.  Disposition: 01-Home or Self Care     Medication List    STOP taking these medications       HYDROcodone-acetaminophen 5-325 MG per tablet  Commonly known as:  NORCO/VICODIN      TAKE these medications       ciprofloxacin 500 MG tablet  Commonly known as:  CIPRO  Take 1 tablet (500 mg total) by mouth 2 (two) times daily.     metroNIDAZOLE 500 MG tablet  Commonly known as:  FLAGYL  Take 500 mg by mouth 2 (two) times daily.     oxyCODONE-acetaminophen 7.5-325 MG per tablet  Commonly known as:  PERCOCET  Take 1-2 tablets by mouth every 4 (four) hours as needed for pain.           Follow-up Information   Follow up with Dalia Heading, MD.  Schedule an appointment as soon as possible for a visit on 04/30/2013.   Specialty:  General Surgery   Contact information:   [address] [place] [address] [phone]       Signed: Franky Macho A 04/25/2013, 8:53 AM

## 2013-08-28 ENCOUNTER — Other Ambulatory Visit: Payer: Self-pay | Admitting: Neurology

## 2013-08-28 DIAGNOSIS — M545 Low back pain: Secondary | ICD-10-CM

## 2013-08-28 DIAGNOSIS — M542 Cervicalgia: Secondary | ICD-10-CM

## 2013-09-06 ENCOUNTER — Ambulatory Visit (HOSPITAL_COMMUNITY)
Admission: RE | Admit: 2013-09-06 | Discharge: 2013-09-06 | Disposition: A | Discharge status: Home / Self Care | Payer: BC Managed Care – PPO | Source: Ambulatory Visit | Attending: Neurology | Admitting: Neurology

## 2013-09-06 ENCOUNTER — Encounter (HOSPITAL_COMMUNITY): Payer: Self-pay

## 2013-09-06 ENCOUNTER — Ambulatory Visit (HOSPITAL_COMMUNITY): Payer: BC Managed Care – PPO

## 2013-09-06 DIAGNOSIS — M542 Cervicalgia: Secondary | ICD-10-CM | POA: Insufficient documentation

## 2013-09-06 DIAGNOSIS — M48061 Spinal stenosis, lumbar region without neurogenic claudication: Secondary | ICD-10-CM | POA: Insufficient documentation

## 2013-09-06 DIAGNOSIS — M5126 Other intervertebral disc displacement, lumbar region: Secondary | ICD-10-CM | POA: Insufficient documentation

## 2013-09-06 DIAGNOSIS — M545 Low back pain, unspecified: Secondary | ICD-10-CM

## 2013-09-06 DIAGNOSIS — M51379 Other intervertebral disc degeneration, lumbosacral region without mention of lumbar back pain or lower extremity pain: Secondary | ICD-10-CM | POA: Insufficient documentation

## 2013-09-06 DIAGNOSIS — M5380 Other specified dorsopathies, site unspecified: Secondary | ICD-10-CM | POA: Insufficient documentation

## 2013-09-06 DIAGNOSIS — M47812 Spondylosis without myelopathy or radiculopathy, cervical region: Secondary | ICD-10-CM | POA: Insufficient documentation

## 2013-09-06 DIAGNOSIS — M5137 Other intervertebral disc degeneration, lumbosacral region: Secondary | ICD-10-CM | POA: Insufficient documentation

## 2014-01-30 ENCOUNTER — Encounter (HOSPITAL_COMMUNITY): Payer: Self-pay | Admitting: Emergency Medicine

## 2014-01-30 ENCOUNTER — Emergency Department (HOSPITAL_COMMUNITY)
Admission: EM | Admit: 2014-01-30 | Discharge: 2014-01-30 | Disposition: A | Discharge status: Home / Self Care | Payer: BC Managed Care – PPO | Attending: Emergency Medicine | Admitting: Emergency Medicine

## 2014-01-30 DIAGNOSIS — L02219 Cutaneous abscess of trunk, unspecified: Secondary | ICD-10-CM | POA: Insufficient documentation

## 2014-01-30 DIAGNOSIS — L0390 Cellulitis, unspecified: Secondary | ICD-10-CM

## 2014-01-30 DIAGNOSIS — Z79899 Other long term (current) drug therapy: Secondary | ICD-10-CM | POA: Insufficient documentation

## 2014-01-30 DIAGNOSIS — L03319 Cellulitis of trunk, unspecified: Principal | ICD-10-CM

## 2014-01-30 DIAGNOSIS — Z8739 Personal history of other diseases of the musculoskeletal system and connective tissue: Secondary | ICD-10-CM | POA: Insufficient documentation

## 2014-01-30 DIAGNOSIS — Z8719 Personal history of other diseases of the digestive system: Secondary | ICD-10-CM | POA: Insufficient documentation

## 2014-01-30 DIAGNOSIS — Z880 Allergy status to penicillin: Secondary | ICD-10-CM | POA: Insufficient documentation

## 2014-01-30 HISTORY — DX: Reserved for concepts with insufficient information to code with codable children: IMO0002

## 2014-01-30 MED ORDER — SULFAMETHOXAZOLE-TMP DS 800-160 MG PO TABS
1.0000 | ORAL_TABLET | Freq: Once | ORAL | Status: AC
  Administered 2014-01-30: 1 via ORAL
  Filled 2014-01-30: qty 1

## 2014-01-30 MED ORDER — CEPHALEXIN 500 MG PO CAPS: 500.0000 mg | ORAL_CAPSULE | Freq: Four times a day (QID) | ORAL | Status: DC

## 2014-01-30 MED ORDER — CEPHALEXIN 500 MG PO CAPS
500.0000 mg | ORAL_CAPSULE | Freq: Once | ORAL | Status: AC
  Administered 2014-01-30: 500 mg via ORAL
  Filled 2014-01-30: qty 1

## 2014-01-30 MED ORDER — SULFAMETHOXAZOLE-TRIMETHOPRIM 800-160 MG PO TABS: 1.0000 | ORAL_TABLET | Freq: Two times a day (BID) | ORAL | Status: DC

## 2014-01-30 NOTE — ED Notes (Signed)
Swollen area to rt chest

## 2014-01-30 NOTE — ED Provider Notes (Signed)
CSN: [id number]     Arrival date & time 01/30/14  1326 History   First MD Initiated Contact with Patient 01/30/14 1340     No chief complaint on file.    (Consider location/radiation/quality/duration/timing/severity/associated sxs/prior Treatment) Patient is a 59 y.o. male presenting with abscess. The history is provided by the patient.  Abscess Location:  Torso Torso abscess location:  R chest Size:  3 cm Abscess quality: painful and redness   Abscess quality: not draining, no fluctuance, no induration, no itching, no warmth and not weeping   Red streaking: no   Progression:  Unchanged Pain details:    Quality:  Throbbing   Severity:  Moderate   Timing:  Constant   Progression:  Worsening Chronicity:  New Context: not diabetes, not injected drug use and not skin injury   Relieved by:  Nothing Worsened by:  Nothing tried Ineffective treatments:  None tried Associated symptoms: no fever, no nausea and no vomiting   Associated symptoms comment:  Myalgias and fatique Risk factors: no hx of MRSA and no prior abscess     Past Medical History  Diagnosis Date  . Diverticulitis     2007  . Complication of anesthesia     pt was combative with right rotator cuff  . DDD (degenerative disc disease)    Past Surgical History  Procedure Laterality Date  . Rt rotator cuff      shoulder  . Hernia repair Right     child  . Colon resection N/A 04/22/2013    Procedure: LAPAROSCOPIC HAND ASSISTED PARTIAL COLECTOMY ;  Surgeon: Jamesetta So, MD;  Location: [facility];  Service: General;  Laterality: N/A;   History reviewed. No pertinent family history. History  Substance Use Topics  . Smoking status: Never Smoker   . Smokeless tobacco: Not on file  . Alcohol Use: No     Comment: rarely    Review of Systems  Constitutional: Negative for fever and chills.  Respiratory: Negative for chest tightness, shortness of breath and wheezing.   Gastrointestinal: Negative for nausea and vomiting.   Musculoskeletal: Negative for arthralgias and joint swelling.  Skin: Positive for color change.       Tender, red area to the right breast  Hematological: Negative for adenopathy.  All other systems reviewed and are negative.     Allergies  Penicillins  Home Medications   Prior to Admission medications   Medication Sig Start Date End Date Taking? Authorizing Provider  ALPRAZolam Duanne Moron) 1 MG tablet Take 1 mg by mouth 2 (two) times daily.   Yes Historical Provider, MD  escitalopram (LEXAPRO) 20 MG tablet Take 10 mg by mouth daily.   Yes Historical Provider, MD  gabapentin (NEURONTIN) 300 MG capsule Take 300 mg by mouth 3 (three) times daily.   Yes Historical Provider, MD  HYDROcodone-acetaminophen (NORCO) 10-325 MG per tablet Take 1 tablet by mouth every 6 (six) hours as needed. pain   Yes Historical Provider, MD  ibuprofen (ADVIL,MOTRIN) 800 MG tablet Take 800 mg by mouth 3 (three) times daily.   Yes Historical Provider, MD  oxymorphone (OPANA ER) 5 MG 12 hr tablet Take 5 mg by mouth every 12 (twelve) hours.   Yes Historical Provider, MD  tiZANidine (ZANAFLEX) 4 MG tablet Take 4 mg by mouth every 8 (eight) hours as needed for muscle spasms.   Yes Historical Provider, MD   BP 100/82  Pulse 106  Temp(Src) 98.3 F (36.8 C) (Oral)  Resp 20  Ht  5\' 11"  (1.803 m)  Wt 235 lb (106.595 kg)  BMI 32.79 kg/m2  SpO2 95% Physical Exam  Nursing note and vitals reviewed. Constitutional: He is oriented to person, place, and time. He appears well-developed and well-nourished. No distress.  HENT:  Head: Normocephalic and atraumatic.  Neck: Normal range of motion. Neck supple.  Cardiovascular: Normal rate, regular rhythm, normal heart sounds and intact distal pulses.   No murmur heard. Pulmonary/Chest: Effort normal and breath sounds normal. No respiratory distress. He has no decreased breath sounds. Right breast exhibits mass, skin change and tenderness. Right breast exhibits no inverted  nipple and no nipple discharge. Left breast exhibits no inverted nipple, no mass, no nipple discharge, no skin change and no tenderness.    Localized 7 cm area of erythema surrounding the right areola.  3 cm area of induration .  No fluctuance or drainage.    Abdominal: Soft. He exhibits no distension and no mass. There is no tenderness. There is no rebound and no guarding.  Musculoskeletal: Normal range of motion.  Lymphadenopathy:    He has no cervical adenopathy.  Neurological: He is alert and oriented to person, place, and time. He exhibits normal muscle tone. Coordination normal.  Skin: Skin is warm and dry.    ED Course  Procedures (including critical care time) Labs Review Labs Reviewed - No data to display  Imaging Review No results found.   EKG Interpretation None      MDM   Final diagnoses:  Cellulitis    Pt is well appearing, non-toxic.  VSS.  Several day h/o erythema and tenderness to the right breast.  No known trauma or h/o of abscess.    Leading edge of erythema was marked by me at 7cm .  Sx's likely related to cellulitis vs early abscess although I have also discussed possibility of neoplasm .  Pt agrees to plan which includes frequent warm soaks, keflex and bactrim and return here in 2-3 days for recheck and possible surgical referral if not improving at that time.  Pt agrees and appears stable for d/c.    Aerica Rincon L. Vanessa Brooker, PA-C 01/31/14 2115

## 2014-01-30 NOTE — Discharge Instructions (Signed)
Cellulitis °Cellulitis is an infection of the skin and the tissue under the skin. The infected area is usually red and tender. This happens most often in the arms and lower legs. °HOME CARE  °· Take your antibiotic medicine as told. Finish the medicine even if you start to feel better. °· Keep the infected arm or leg raised (elevated). °· Put a warm cloth on the area up to 4 times per day. °· Only take medicines as told by your doctor. °· Keep all doctor visits as told. °GET HELP RIGHT AWAY IF:  °· You have a fever. °· You feel very sleepy. °· You throw up (vomit) or have watery poop (diarrhea). °· You feel sick and have muscle aches and pains. °· You see red streaks on the skin coming from the infected area. °· Your red area gets bigger or turns a dark color. °· Your bone or joint under the infected area is painful after the skin heals. °· Your infection comes back in the same area or different area. °· You have a puffy (swollen) bump in the infected area. °· You have new symptoms. °MAKE SURE YOU:  °· Understand these instructions. °· Will watch your condition. °· Will get help right away if you are not doing well or get worse. °Document Released: 02/01/2008 Document Revised: 02/14/2012 Document Reviewed: 10/31/2011 °[employer]® Patient Information ©2014 [employer]. ° °

## 2014-02-01 NOTE — ED Provider Notes (Signed)
Medical screening examination/treatment/procedure(s) were performed by non-physician practitioner and as supervising physician I was immediately available for consultation/collaboration.   EKG Interpretation None       Richarda Blade, MD 02/01/14 [phone]

## 2014-08-09 IMAGING — CT CT ABD-PELV W/ CM
2 of 5 series · 15 of 46 positions shown, 17 images · IV contrast (Omnipaque 300)
Comparison: CT scan 12/19/2011

CLINICAL DATA: Abdominal pain history of of diverticulitis.  No
bowel movements since [REDACTED]

CT ABDOMEN AND PELVIS WITH CONTRAST
TECHNIQUE: Multidetector CT imaging of the abdomen and pelvis was
performed following the standard protocol during bolus
administration of intravenous contrast.
Contrast: 50mL OMNIPAQUE IOHEXOL 300 MG/ML  SOLN, 100mL OMNIPAQUE
IOHEXOL 300 MG/ML  SOLN

[Series 2: abd_pel_with 5.0 b40f · axial · 0.81mm/px · z∈[-524,-74]mm · 12 of 103 slices shown, 14 images]
[im 7/103  soft-tissue]
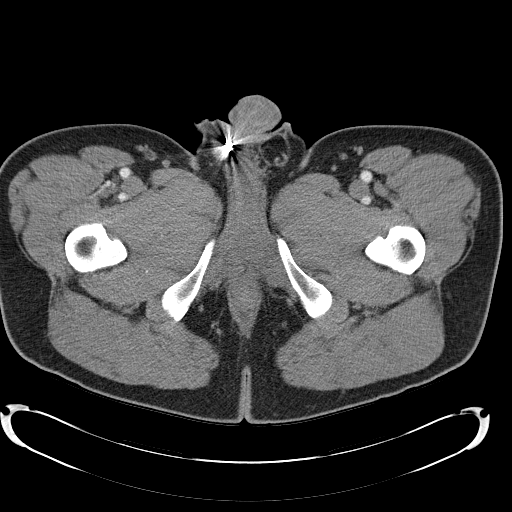
[im 7/103  bone]
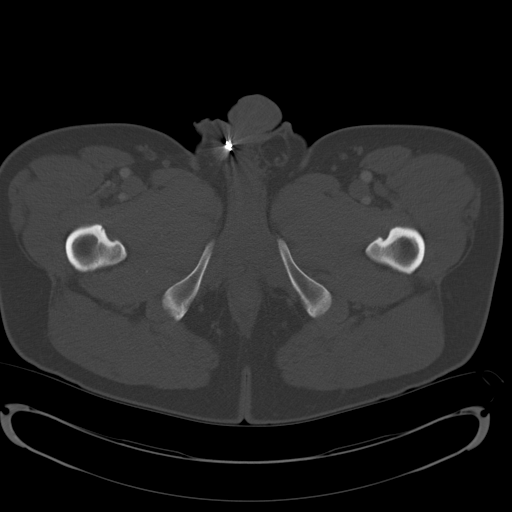
[im 19/103  soft-tissue]
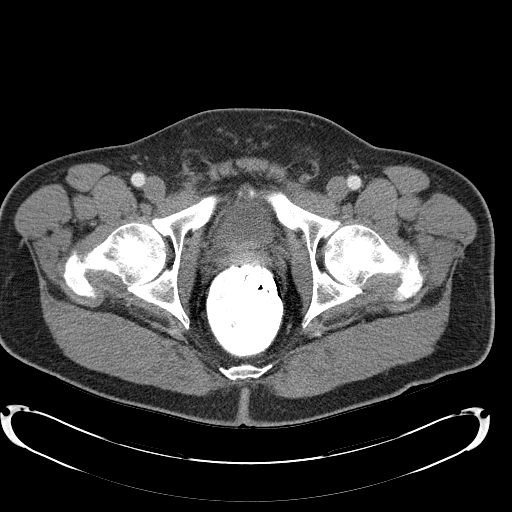
[im 25/103  soft-tissue]
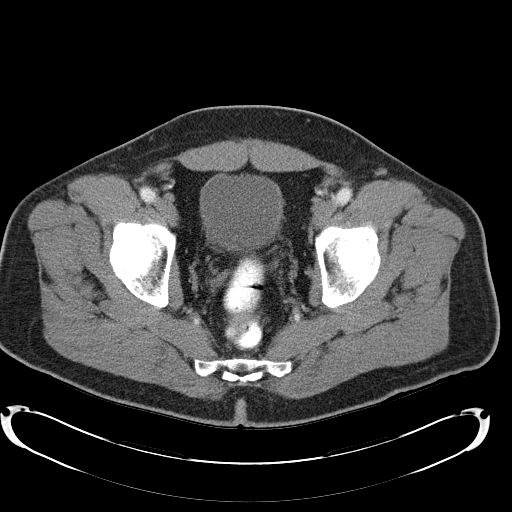
[im 31/103  soft-tissue]
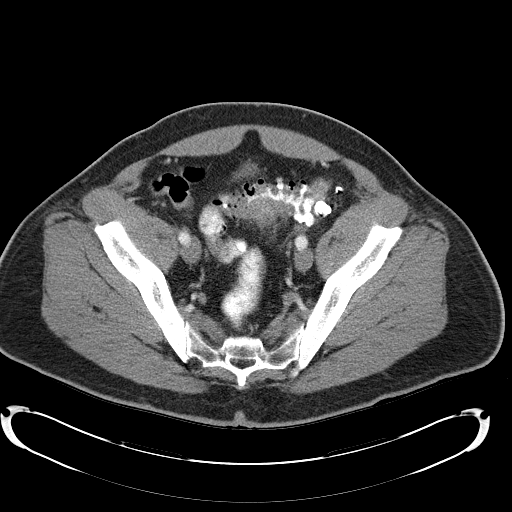
[im 43/103  soft-tissue]
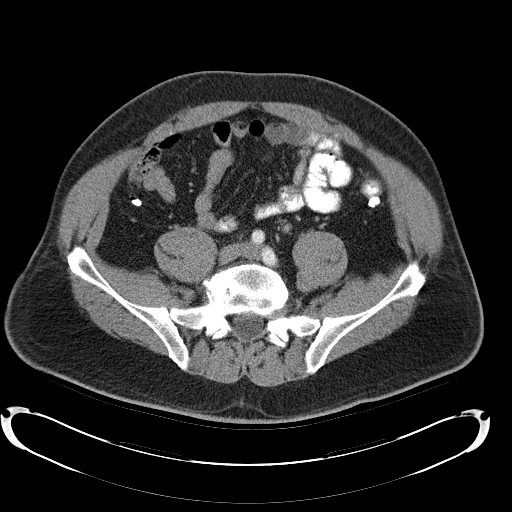
[im 49/103  soft-tissue]
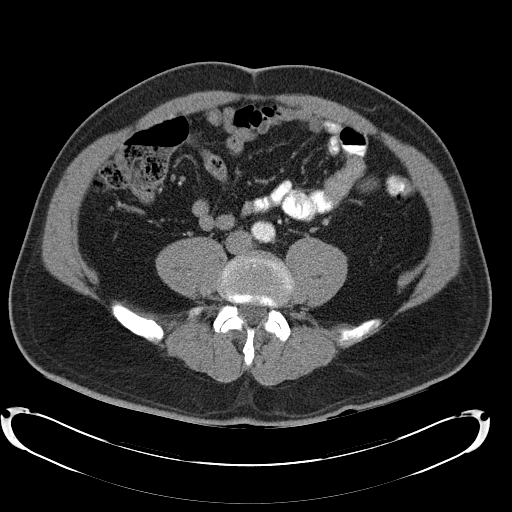
[im 55/103  soft-tissue]
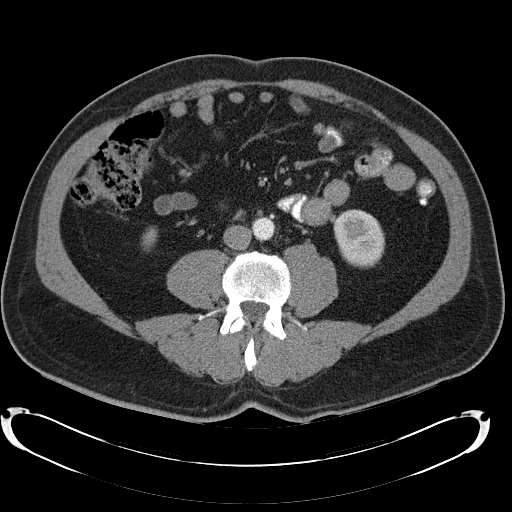
[im 67/103  soft-tissue]
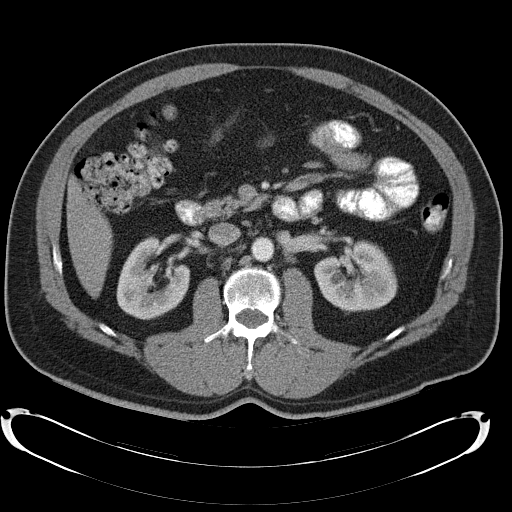
[im 73/103  soft-tissue]
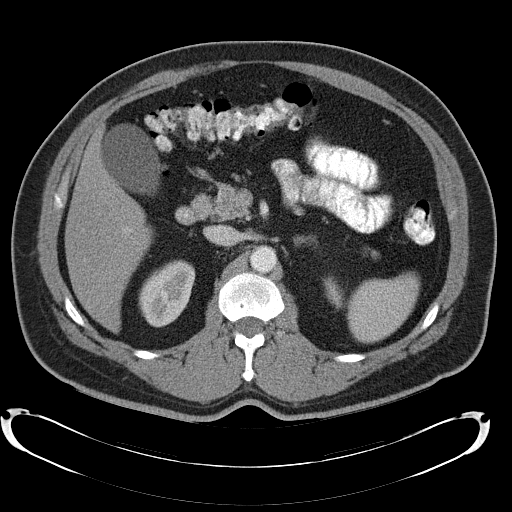
[im 73/103  bone]
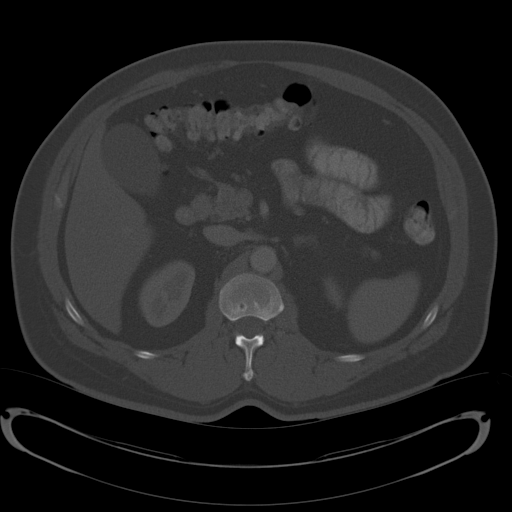
[im 79/103  soft-tissue]
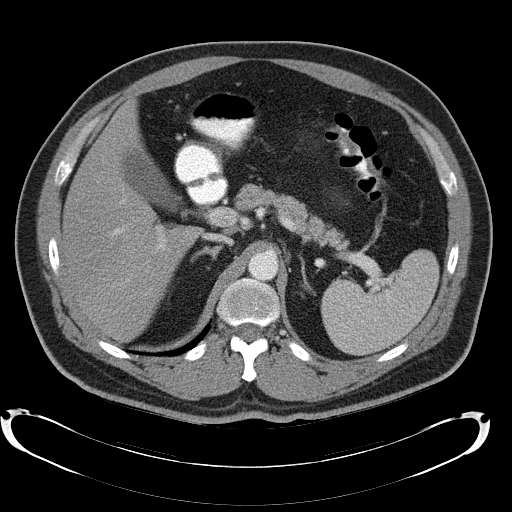
[im 91/103  soft-tissue]
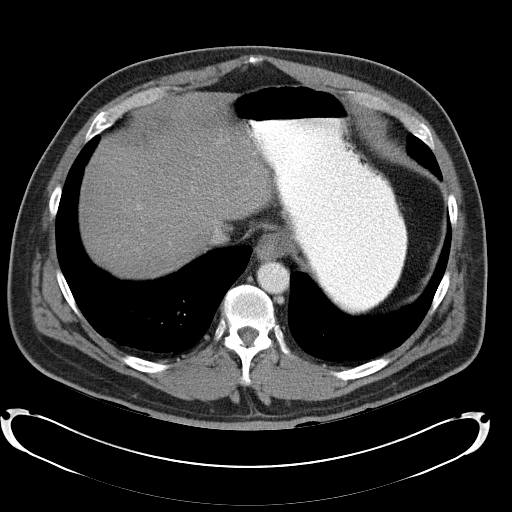
[im 97/103  soft-tissue]
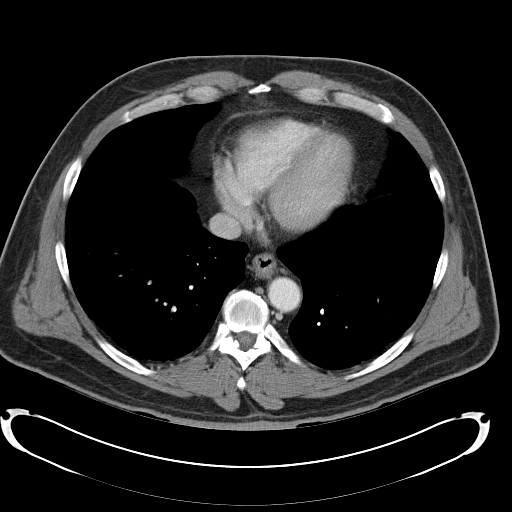

[Series 4: abd_pel_with 3.0 spo cor · coronal · 0.79mm/px · 3 of 102 slices shown]
[im 34/102  soft-tissue]
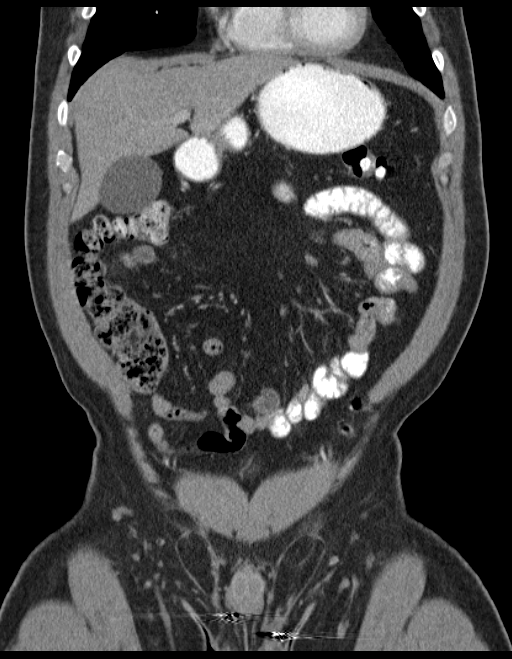
[im 45/102  soft-tissue]
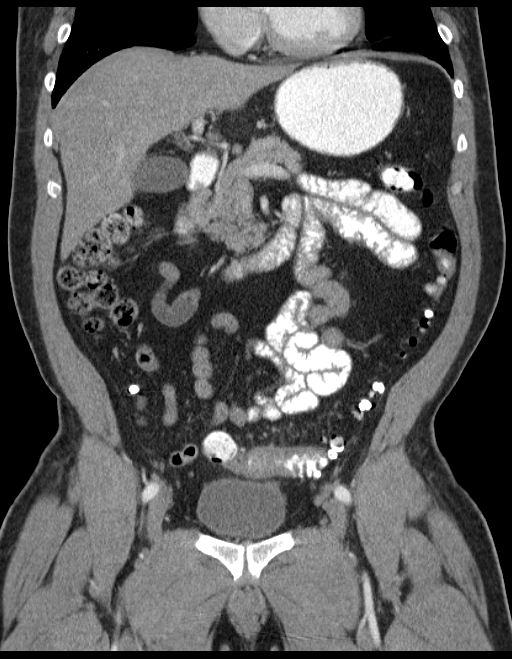
[im 57/102  soft-tissue]
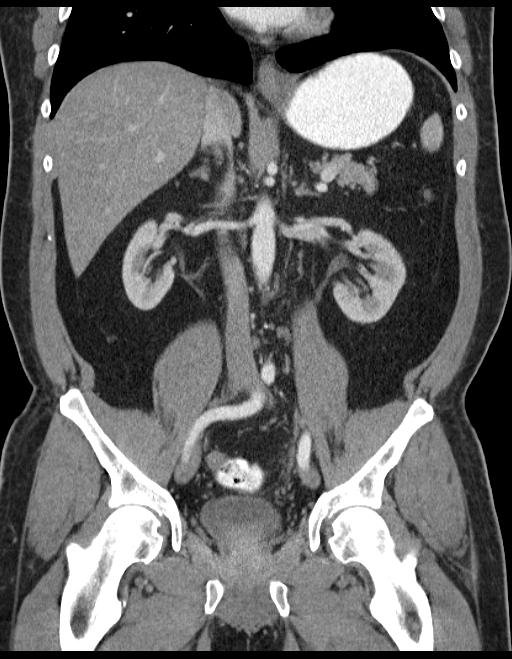

[15 of 46 positions shown; findings below may reference images not displayed]

FINDINGS: Lung bases are unremarkable.  Sagittal images of the
spine shows disc space flattening with vacuum disc phenomenon and
anterior spurring at L5 S1 level.

Enhanced liver is unremarkable.  No calcified gallstones are noted
within gallbladder.  Enhanced pancreas, spleen and adrenal glands
are unremarkable.  Enhanced kidneys are symmetrical in size.  No
hydronephrosis or hydroureter.  Delayed renal images shows
bilateral renal symmetrical excretion.  Bilateral visualized
proximal ureter is unremarkable.

No aortic aneurysm.  No small bowel obstruction.  No free abdominal
air.

No aortic aneurysm.

Scattered transverse colon diverticula are noted.  No pericecal
inflammation.  No evidence of acute diverticulitis  in  transverse
colon.  Normal appendix containing contrast is clearly visualized
in axial image 62.  No pericecal inflammation.  Stool noted within
cecum.  Scattered left colon diverticula are noted.  No evidence of
acute diverticulitis.  Multiple sigmoid colon diverticula are
noted.  There is a short segment thickening of the sigmoid colon
wall in the midline pelvis just above the urinary bladder.  This is
best visualized in axial image 73.  There is mild stranding of the
pericolonic fat just posterior to sigmoid colon in the axial image
73 and sagittal image 72.  Findings are consistent with acute
diverticulitis or a short segment focal colitis.  Moderate stool
and contrast noted within the rectum.  The rectum is distended
measures about 6 cm in diameter consistent with mild fecal
impaction.  The urinary bladder is unremarkable.  Prostate gland
and seminal vesicles are unremarkable.  Bilateral small inguinal
scrotal canal hernia containing fat is noted without evidence of
acute complication.  The right inguinal hernia measures 2.7 cm the
left inguinal hernia containing fat measures 4.2 cm.  Surgical
clips are noted bilateral upper scrotum.
IMPRESSION: 1.  Multiple sigmoid colon diverticula are noted.  Abnormal focal
thickening of the sigmoid colon wall and stranding of the
surrounding fat especially posteriorly in the mid pelvis proximal
sigmoid colon.  Findings are consistent with mild diverticulitis or
segmental colitis.  No pericolonic abscess or extraluminal contrast
material.
2.  Moderate stool and contrast material noted within the moderate
distended rectum. The rectum measures 6 cm in diameter suspicious
for mild fecal impaction.
3.  No pericecal inflammation.  Normal appendix.  Colonic
diverticula are noted transverse colon and descending colon without
evidence of acute diverticulitis.
4.  Bilateral inguinal scrotal canal small hernias containing fat
without evidence of acute complication.

## 2015-11-12 ENCOUNTER — Other Ambulatory Visit: Payer: Self-pay | Admitting: Neurosurgery

## 2015-12-09 ENCOUNTER — Inpatient Hospital Stay (HOSPITAL_COMMUNITY)
Admission: RE | Admit: 2015-12-09 | Discharge: 2015-12-09 | Disposition: A | Discharge status: Home / Self Care | Payer: Self-pay | Source: Ambulatory Visit

## 2015-12-09 NOTE — Pre-Procedure Instructions (Signed)
    James Simpson  12/09/2015      [id number] - [place], [place] [address] [place] [address] Phone: [phone] Fax: [phone]    Your procedure is scheduled on 12/17/15.  Report to [facility] Admitting at [phone] A.M.  Call this number if you have problems the morning of surgery:  [phone]   Remember:  Do not eat food or drink liquids after midnight.  Take these medicines the morning of surgery with A SIP OF WATER --xanax,celexa,neurontin,hydrocodone   Do not wear jewelry, make-up or nail polish.  Do not wear lotions, powders, or perfumes.  You may wear deodorant.  Do not shave 48 hours prior to surgery.  Men may shave face and neck.  Do not bring valuables to the hospital.  [facility] is not responsible for any belongings or valuables.  Contacts, dentures or bridgework may not be worn into surgery.  Leave your suitcase in the car.  After surgery it may be brought to your room.  For patients admitted to the hospital, discharge time will be determined by your treatment team.  Patients discharged the day of surgery will not be allowed to drive home.   Name and phone number of your driver:   Special instructions:    Please read over the following fact sheets that you were given. Pain Booklet, Coughing and Deep Breathing, Blood Transfusion Information, MRSA Information and Surgical Site Infection Prevention

## 2015-12-16 ENCOUNTER — Inpatient Hospital Stay (HOSPITAL_COMMUNITY): Admission: RE | Admit: 2015-12-16 | Payer: Self-pay | Source: Ambulatory Visit

## 2015-12-17 ENCOUNTER — Encounter (HOSPITAL_COMMUNITY): Admission: RE | Payer: Self-pay | Source: Ambulatory Visit

## 2015-12-17 ENCOUNTER — Inpatient Hospital Stay (HOSPITAL_COMMUNITY): Admission: RE | Admit: 2015-12-17 | Payer: BLUE CROSS/BLUE SHIELD | Source: Ambulatory Visit | Admitting: Neurosurgery

## 2015-12-17 SURGERY — POSTERIOR LUMBAR FUSION 1 LEVEL
Anesthesia: General

## 2016-01-12 DIAGNOSIS — M542 Cervicalgia: Secondary | ICD-10-CM | POA: Diagnosis not present

## 2016-01-12 DIAGNOSIS — K5792 Diverticulitis of intestine, part unspecified, without perforation or abscess without bleeding: Secondary | ICD-10-CM | POA: Diagnosis not present

## 2016-01-12 DIAGNOSIS — Z79899 Other long term (current) drug therapy: Secondary | ICD-10-CM | POA: Diagnosis not present

## 2016-01-12 DIAGNOSIS — M5412 Radiculopathy, cervical region: Secondary | ICD-10-CM | POA: Diagnosis not present

## 2016-01-12 DIAGNOSIS — M549 Dorsalgia, unspecified: Secondary | ICD-10-CM | POA: Diagnosis not present

## 2016-01-12 DIAGNOSIS — M79606 Pain in leg, unspecified: Secondary | ICD-10-CM | POA: Diagnosis not present

## 2016-01-14 ENCOUNTER — Other Ambulatory Visit: Payer: Self-pay | Admitting: Neurosurgery

## 2016-01-18 DIAGNOSIS — Z880 Allergy status to penicillin: Secondary | ICD-10-CM | POA: Diagnosis not present

## 2016-01-18 DIAGNOSIS — D170 Benign lipomatous neoplasm of skin and subcutaneous tissue of head, face and neck: Secondary | ICD-10-CM | POA: Diagnosis not present

## 2016-01-18 DIAGNOSIS — D179 Benign lipomatous neoplasm, unspecified: Secondary | ICD-10-CM | POA: Diagnosis not present

## 2016-01-18 DIAGNOSIS — L723 Sebaceous cyst: Secondary | ICD-10-CM | POA: Diagnosis not present

## 2016-01-28 ENCOUNTER — Encounter (HOSPITAL_COMMUNITY)
Admission: RE | Admit: 2016-01-28 | Discharge: 2016-01-28 | Disposition: A | Discharge status: Home / Self Care | Payer: Medicare Other | Source: Ambulatory Visit | Attending: Neurosurgery | Admitting: Neurosurgery

## 2016-01-28 DIAGNOSIS — Z01812 Encounter for preprocedural laboratory examination: Secondary | ICD-10-CM | POA: Insufficient documentation

## 2016-01-28 DIAGNOSIS — M5137 Other intervertebral disc degeneration, lumbosacral region: Secondary | ICD-10-CM | POA: Diagnosis not present

## 2016-01-28 DIAGNOSIS — I10 Essential (primary) hypertension: Secondary | ICD-10-CM | POA: Diagnosis not present

## 2016-01-28 DIAGNOSIS — Z79899 Other long term (current) drug therapy: Secondary | ICD-10-CM | POA: Diagnosis not present

## 2016-01-28 DIAGNOSIS — Z01818 Encounter for other preprocedural examination: Secondary | ICD-10-CM | POA: Insufficient documentation

## 2016-01-28 HISTORY — DX: Essential (primary) hypertension: I10

## 2016-01-28 LAB — BASIC METABOLIC PANEL
ANION GAP: 5 (ref 5–15)
BUN: 15 mg/dL (ref 6–20)
CO2: 29 mmol/L (ref 22–32)
Calcium: 9.1 mg/dL (ref 8.9–10.3)
Chloride: 105 mmol/L (ref 101–111)
Creatinine, Ser: 1.24 mg/dL (ref 0.61–1.24)
GFR calc Af Amer: >60 mL/min (ref >60)
Glucose, Bld: 97 mg/dL (ref 65–99)
POTASSIUM: 3.9 mmol/L (ref 3.5–5.1)
SODIUM: 139 mmol/L (ref 135–145)

## 2016-01-28 LAB — CBC
HEMATOCRIT: 43.9 % (ref 39.0–52.0)
Hemoglobin: 14.5 g/dL (ref 13.0–17.0)
MCH: 30.5 pg (ref 26.0–34.0)
MCHC: 33 g/dL (ref 30.0–36.0)
MCV: 92.2 fL (ref 78.0–100.0)
Platelets: 161 10*3/uL (ref 150–400)
RBC: 4.76 MIL/uL (ref 4.22–5.81)
RDW: 12.8 % (ref 11.5–15.5)
WBC: 6.7 10*3/uL (ref 4.0–10.5)

## 2016-01-28 LAB — SURGICAL PCR SCREEN
MRSA, PCR: NEGATIVE
STAPHYLOCOCCUS AUREUS: NEGATIVE

## 2016-01-28 NOTE — Pre-Procedure Instructions (Signed)
    James Simpson  01/28/2016      [id number] - [place], [place] - [address] [place] [address] Phone: [phone] Fax: [phone]    Your procedure is scheduled on February 04, 2016.  Report to [facility] Admitting at 8:45 A.M.  Call this number if you have problems the morning of surgery:  [phone]  For questions prior to surgery call [phone] M-F [id number]  Remember:  Do not eat food or drink liquids after midnight.  Take these medicines the morning of surgery with A SIP OF WATER :ALPRAZolam (XANAX), escitalopram (LEXAPRO)  IF NEEDED: gabapentin (NEURONTIN) , HYDROmorphone (DILAUDID)   STOP ASPIRIN, HERBAL MEDICATIONS, NSAID'S(ADVIL, IBUPROFEN, ALEVE)  AS OF TODAY    Do not wear jewelry, make-up or nail polish.  Do not wear lotions, powders, or perfumes.  You may wear deodorant.  Men may shave face and neck only.  Do not bring valuables to the hospital.  [facility] is not responsible for any belongings or valuables.  Contacts, dentures or bridgework may not be worn into surgery.  Leave your suitcase in the car.  After surgery it may be brought to your room.  For patients admitted to the hospital, discharge time will be determined by your treatment team.  Patients discharged the day of surgery will not be allowed to drive home.   Name and phone number of your driver:    Special instructions:  "Preparing for Surgery"  Please read over the following fact sheets that you were given. Pain Booklet, Coughing and Deep Breathing, Blood Transfusion Information and Surgical Site Infection Prevention

## 2016-01-28 NOTE — Progress Notes (Signed)
This patient scored at an elevated risk for obstructive sleep apnea during a presurgical testing using the STOP BANG TOOL

## 2016-01-29 ENCOUNTER — Encounter (HOSPITAL_COMMUNITY): Payer: Self-pay

## 2016-01-29 NOTE — Progress Notes (Addendum)
Anesthesia Chart Review:  Pt is a 60 year old male scheduled for L5-S1 PLIF on 02/04/2016 with Dr. Arnoldo Morale.   PMH includes:  HTN. Never smoker. BMI 36. S/p partial colectomy 04/22/13  Medications include: lisinopril-hctz.   Preoperative labs reviewed.    EKG 01/28/16: NSR. Inferior infarct, age undetermined. No significant change from previous EKG 04/17/13 per Dr. Elmarie Shiley interpretation.   Nuclear stress test 03/30/15 [facility] hospital): Negative nuclear stress test  If no changes, I anticipate pt can proceed with surgery as scheduled.   Willeen Cass, FNP-BC [facility]/Anesthesiology Phone: [phone] 02/01/2016 3:41 PM

## 2016-02-03 MED ORDER — VANCOMYCIN HCL 10 G IV SOLR
1500.0000 mg | INTRAVENOUS | Status: AC
  Administered 2016-02-04: 1500 mg via INTRAVENOUS
  Filled 2016-02-03: qty 1500

## 2016-02-04 ENCOUNTER — Inpatient Hospital Stay (HOSPITAL_COMMUNITY)
Admission: RE | Admit: 2016-02-04 | Discharge: 2016-02-06 | DRG: 460 | Disposition: A | Discharge status: Home / Self Care | Payer: Medicare Other | Source: Ambulatory Visit | Attending: Neurosurgery | Admitting: Neurosurgery

## 2016-02-04 ENCOUNTER — Inpatient Hospital Stay (HOSPITAL_COMMUNITY): Payer: Medicare Other | Admitting: Anesthesiology

## 2016-02-04 ENCOUNTER — Inpatient Hospital Stay (HOSPITAL_COMMUNITY): Payer: Medicare Other

## 2016-02-04 ENCOUNTER — Encounter (HOSPITAL_COMMUNITY): Payer: Self-pay | Admitting: Surgery

## 2016-02-04 ENCOUNTER — Inpatient Hospital Stay (HOSPITAL_COMMUNITY): Payer: Medicare Other | Admitting: Emergency Medicine

## 2016-02-04 ENCOUNTER — Encounter (HOSPITAL_COMMUNITY)
Admission: RE | Disposition: A | Discharge status: Home / Self Care | Payer: Self-pay | Source: Ambulatory Visit | Attending: Neurosurgery

## 2016-02-04 DIAGNOSIS — Z6836 Body mass index (BMI) 36.0-36.9, adult: Secondary | ICD-10-CM | POA: Diagnosis not present

## 2016-02-04 DIAGNOSIS — M51369 Other intervertebral disc degeneration, lumbar region without mention of lumbar back pain or lower extremity pain: Secondary | ICD-10-CM | POA: Diagnosis present

## 2016-02-04 DIAGNOSIS — M5117 Intervertebral disc disorders with radiculopathy, lumbosacral region: Principal | ICD-10-CM | POA: Diagnosis present

## 2016-02-04 DIAGNOSIS — M79606 Pain in leg, unspecified: Secondary | ICD-10-CM | POA: Diagnosis not present

## 2016-02-04 DIAGNOSIS — M4726 Other spondylosis with radiculopathy, lumbar region: Secondary | ICD-10-CM | POA: Diagnosis not present

## 2016-02-04 DIAGNOSIS — Z981 Arthrodesis status: Secondary | ICD-10-CM | POA: Diagnosis not present

## 2016-02-04 DIAGNOSIS — Z79899 Other long term (current) drug therapy: Secondary | ICD-10-CM | POA: Diagnosis not present

## 2016-02-04 DIAGNOSIS — Z880 Allergy status to penicillin: Secondary | ICD-10-CM | POA: Diagnosis not present

## 2016-02-04 DIAGNOSIS — E669 Obesity, unspecified: Secondary | ICD-10-CM | POA: Diagnosis present

## 2016-02-04 DIAGNOSIS — I10 Essential (primary) hypertension: Secondary | ICD-10-CM | POA: Diagnosis present

## 2016-02-04 DIAGNOSIS — M5137 Other intervertebral disc degeneration, lumbosacral region: Secondary | ICD-10-CM | POA: Diagnosis not present

## 2016-02-04 DIAGNOSIS — M5136 Other intervertebral disc degeneration, lumbar region: Secondary | ICD-10-CM | POA: Diagnosis present

## 2016-02-04 DIAGNOSIS — Z419 Encounter for procedure for purposes other than remedying health state, unspecified: Secondary | ICD-10-CM

## 2016-02-04 LAB — TYPE AND SCREEN
ABO/RH(D): O NEG
Antibody Screen: NEGATIVE

## 2016-02-04 LAB — ABO/RH: ABO/RH(D): O NEG

## 2016-02-04 SURGERY — POSTERIOR LUMBAR FUSION 1 LEVEL
Anesthesia: General

## 2016-02-04 MED ORDER — PHENOL 1.4 % MT LIQD: 1.0000 | OROMUCOSAL | Status: DC | PRN

## 2016-02-04 MED ORDER — BUPIVACAINE-EPINEPHRINE (PF) 0.5% -1:200000 IJ SOLN
INTRAMUSCULAR | Status: DC | PRN
Start: 2016-02-04 — End: 2016-02-04
  Administered 2016-02-04: 10 mL

## 2016-02-04 MED ORDER — MEPERIDINE HCL 25 MG/ML IJ SOLN: 6.2500 mg | INTRAMUSCULAR | Status: DC | PRN

## 2016-02-04 MED ORDER — LACTATED RINGERS IV SOLN
INTRAVENOUS | Status: DC
  Administered 2016-02-04 (×5): via INTRAVENOUS

## 2016-02-04 MED ORDER — LIDOCAINE HCL (CARDIAC) 20 MG/ML IV SOLN
INTRAVENOUS | Status: DC | PRN
  Administered 2016-02-04: 80 mg via INTRAVENOUS

## 2016-02-04 MED ORDER — BISACODYL 10 MG RE SUPP: 10.0000 mg | Freq: Every day | RECTAL | Status: DC | PRN

## 2016-02-04 MED ORDER — HYDROCODONE-ACETAMINOPHEN 5-325 MG PO TABS
1.0000 | ORAL_TABLET | ORAL | Status: DC | PRN
  Administered 2016-02-05: 2 via ORAL
  Filled 2016-02-04: qty 2

## 2016-02-04 MED ORDER — VANCOMYCIN HCL 1000 MG IV SOLR
INTRAVENOUS | Status: AC
  Filled 2016-02-04: qty 1000

## 2016-02-04 MED ORDER — LISINOPRIL-HYDROCHLOROTHIAZIDE 20-25 MG PO TABS: 1.0000 | ORAL_TABLET | Freq: Every day | ORAL | Status: DC

## 2016-02-04 MED ORDER — ROCURONIUM BROMIDE 50 MG/5ML IV SOLN
INTRAVENOUS | Status: AC
  Filled 2016-02-04: qty 1

## 2016-02-04 MED ORDER — VANCOMYCIN HCL IN DEXTROSE 1-5 GM/200ML-% IV SOLN: 1000.0000 mg | INTRAVENOUS | Status: DC

## 2016-02-04 MED ORDER — BACITRACIN ZINC 500 UNIT/GM EX OINT
TOPICAL_OINTMENT | CUTANEOUS | Status: DC | PRN
  Administered 2016-02-04: 1 via TOPICAL

## 2016-02-04 MED ORDER — THROMBIN 20000 UNITS EX KIT
PACK | CUTANEOUS | Status: DC | PRN
  Administered 2016-02-04 (×2): 20000 [IU] via TOPICAL

## 2016-02-04 MED ORDER — MENTHOL 3 MG MT LOZG: 1.0000 | LOZENGE | OROMUCOSAL | Status: DC | PRN

## 2016-02-04 MED ORDER — ALUM & MAG HYDROXIDE-SIMETH 200-200-20 MG/5ML PO SUSP: 30.0000 mL | Freq: Four times a day (QID) | ORAL | Status: DC | PRN

## 2016-02-04 MED ORDER — ALPRAZOLAM 0.5 MG PO TABS
1.0000 mg | ORAL_TABLET | Freq: Two times a day (BID) | ORAL | Status: DC
  Administered 2016-02-04 – 2016-02-05 (×2): 1 mg via ORAL
  Filled 2016-02-04 (×3): qty 2

## 2016-02-04 MED ORDER — DOCUSATE SODIUM 100 MG PO CAPS
100.0000 mg | ORAL_CAPSULE | Freq: Two times a day (BID) | ORAL | Status: DC
  Administered 2016-02-04 – 2016-02-06 (×4): 100 mg via ORAL
  Filled 2016-02-04 (×4): qty 1

## 2016-02-04 MED ORDER — PHENYLEPHRINE HCL 10 MG/ML IJ SOLN
INTRAMUSCULAR | Status: DC | PRN
  Administered 2016-02-04: 80 ug via INTRAVENOUS

## 2016-02-04 MED ORDER — FENTANYL CITRATE (PF) 250 MCG/5ML IJ SOLN
INTRAMUSCULAR | Status: AC
  Filled 2016-02-04: qty 5

## 2016-02-04 MED ORDER — SUGAMMADEX SODIUM 200 MG/2ML IV SOLN
INTRAVENOUS | Status: DC | PRN
  Administered 2016-02-04: 300 mg via INTRAVENOUS

## 2016-02-04 MED ORDER — DEXMEDETOMIDINE HCL IN NACL 200 MCG/50ML IV SOLN
INTRAVENOUS | Status: DC | PRN
  Administered 2016-02-04 (×10): 8 ug via INTRAVENOUS

## 2016-02-04 MED ORDER — EPHEDRINE SULFATE 50 MG/ML IJ SOLN
INTRAMUSCULAR | Status: DC | PRN
  Administered 2016-02-04 (×3): 10 mg via INTRAVENOUS

## 2016-02-04 MED ORDER — MIDAZOLAM HCL 2 MG/2ML IJ SOLN
INTRAMUSCULAR | Status: AC
  Filled 2016-02-04: qty 2

## 2016-02-04 MED ORDER — HYDROCHLOROTHIAZIDE 25 MG PO TABS
25.0000 mg | ORAL_TABLET | Freq: Every day | ORAL | Status: DC
  Administered 2016-02-05 – 2016-02-06 (×2): 25 mg via ORAL
  Filled 2016-02-04 (×2): qty 1

## 2016-02-04 MED ORDER — LISINOPRIL 20 MG PO TABS
20.0000 mg | ORAL_TABLET | Freq: Every day | ORAL | Status: DC
  Administered 2016-02-05 – 2016-02-06 (×2): 20 mg via ORAL
  Filled 2016-02-04 (×2): qty 1

## 2016-02-04 MED ORDER — OXYCODONE-ACETAMINOPHEN 5-325 MG PO TABS
1.0000 | ORAL_TABLET | ORAL | Status: DC | PRN
  Administered 2016-02-04 (×2): 2 via ORAL
  Filled 2016-02-04 (×2): qty 2

## 2016-02-04 MED ORDER — ESCITALOPRAM OXALATE 10 MG PO TABS
20.0000 mg | ORAL_TABLET | Freq: Every day | ORAL | Status: DC
  Administered 2016-02-05 – 2016-02-06 (×2): 20 mg via ORAL
  Filled 2016-02-04 (×2): qty 2

## 2016-02-04 MED ORDER — ADULT MULTIVITAMIN W/MINERALS CH
1.0000 | ORAL_TABLET | Freq: Every day | ORAL | Status: DC
  Administered 2016-02-05 – 2016-02-06 (×2): 1 via ORAL
  Filled 2016-02-04 (×3): qty 1

## 2016-02-04 MED ORDER — ONDANSETRON HCL 4 MG/2ML IJ SOLN
INTRAMUSCULAR | Status: DC | PRN
  Administered 2016-02-04: 4 mg via INTRAVENOUS

## 2016-02-04 MED ORDER — PHENYLEPHRINE HCL 10 MG/ML IJ SOLN
10.0000 mg | INTRAVENOUS | Status: DC | PRN
  Administered 2016-02-04: 20 ug/min via INTRAVENOUS

## 2016-02-04 MED ORDER — ONDANSETRON HCL 4 MG/2ML IJ SOLN: 4.0000 mg | INTRAMUSCULAR | Status: DC | PRN

## 2016-02-04 MED ORDER — MIDAZOLAM HCL 5 MG/5ML IJ SOLN
INTRAMUSCULAR | Status: DC | PRN
  Administered 2016-02-04: 2 mg via INTRAVENOUS

## 2016-02-04 MED ORDER — DIAZEPAM 5 MG PO TABS
5.0000 mg | ORAL_TABLET | Freq: Four times a day (QID) | ORAL | Status: DC | PRN
  Administered 2016-02-05 – 2016-02-06 (×2): 5 mg via ORAL
  Filled 2016-02-04 (×2): qty 1

## 2016-02-04 MED ORDER — SODIUM CHLORIDE 0.9 % IR SOLN
Status: DC | PRN
  Administered 2016-02-04: 500 mL

## 2016-02-04 MED ORDER — ACETAMINOPHEN 325 MG PO TABS: 650.0000 mg | ORAL_TABLET | ORAL | Status: DC | PRN

## 2016-02-04 MED ORDER — METOCLOPRAMIDE HCL 5 MG/ML IJ SOLN: 10.0000 mg | Freq: Once | INTRAMUSCULAR | Status: DC | PRN

## 2016-02-04 MED ORDER — HEMOSTATIC AGENTS (NO CHARGE) OPTIME
TOPICAL | Status: DC | PRN
  Administered 2016-02-04: 1 via TOPICAL

## 2016-02-04 MED ORDER — BUPIVACAINE LIPOSOME 1.3 % IJ SUSP
INTRAMUSCULAR | Status: DC | PRN
  Administered 2016-02-04: 20 mL

## 2016-02-04 MED ORDER — HYDROMORPHONE HCL 2 MG PO TABS
4.0000 mg | ORAL_TABLET | ORAL | Status: DC | PRN
  Administered 2016-02-05 – 2016-02-06 (×4): 4 mg via ORAL
  Filled 2016-02-04 (×4): qty 2

## 2016-02-04 MED ORDER — GABAPENTIN 800 MG PO TABS
800.0000 mg | ORAL_TABLET | Freq: Four times a day (QID) | ORAL | Status: DC | PRN
  Filled 2016-02-04: qty 1

## 2016-02-04 MED ORDER — VANCOMYCIN HCL 1000 MG IV SOLR
INTRAVENOUS | Status: DC | PRN
  Administered 2016-02-04: 1000 mg via TOPICAL

## 2016-02-04 MED ORDER — LACTATED RINGERS IV SOLN
INTRAVENOUS | Status: DC
  Administered 2016-02-05: via INTRAVENOUS

## 2016-02-04 MED ORDER — TIZANIDINE HCL 4 MG PO TABS
4.0000 mg | ORAL_TABLET | Freq: Three times a day (TID) | ORAL | Status: DC | PRN
  Administered 2016-02-04 – 2016-02-06 (×3): 4 mg via ORAL
  Filled 2016-02-04 (×3): qty 1

## 2016-02-04 MED ORDER — ROCURONIUM BROMIDE 100 MG/10ML IV SOLN
INTRAVENOUS | Status: DC | PRN
  Administered 2016-02-04: 50 mg via INTRAVENOUS

## 2016-02-04 MED ORDER — ONDANSETRON HCL 4 MG/2ML IJ SOLN
INTRAMUSCULAR | Status: AC
  Filled 2016-02-04: qty 2

## 2016-02-04 MED ORDER — GABAPENTIN 400 MG PO CAPS
800.0000 mg | ORAL_CAPSULE | Freq: Four times a day (QID) | ORAL | Status: DC | PRN
  Administered 2016-02-05 (×3): 800 mg via ORAL
  Filled 2016-02-04 (×3): qty 2

## 2016-02-04 MED ORDER — HYDROMORPHONE HCL 1 MG/ML IJ SOLN
0.2500 mg | INTRAMUSCULAR | Status: DC | PRN
  Administered 2016-02-04: 1 mg via INTRAVENOUS

## 2016-02-04 MED ORDER — ACETAMINOPHEN 650 MG RE SUPP: 650.0000 mg | RECTAL | Status: DC | PRN

## 2016-02-04 MED ORDER — BUPIVACAINE LIPOSOME 1.3 % IJ SUSP
20.0000 mL | INTRAMUSCULAR | Status: AC
  Filled 2016-02-04: qty 20

## 2016-02-04 MED ORDER — PROPOFOL 10 MG/ML IV BOLUS
INTRAVENOUS | Status: AC
  Filled 2016-02-04: qty 20

## 2016-02-04 MED ORDER — DEXMEDETOMIDINE HCL IN NACL 200 MCG/50ML IV SOLN
INTRAVENOUS | Status: AC
  Filled 2016-02-04: qty 50

## 2016-02-04 MED ORDER — PROPOFOL 10 MG/ML IV BOLUS
INTRAVENOUS | Status: DC | PRN
  Administered 2016-02-04: 200 mg via INTRAVENOUS

## 2016-02-04 MED ORDER — VANCOMYCIN HCL 10 G IV SOLR
1500.0000 mg | Freq: Once | INTRAVENOUS | Status: AC
  Administered 2016-02-05: 1500 mg via INTRAVENOUS
  Filled 2016-02-04: qty 1500

## 2016-02-04 MED ORDER — MORPHINE SULFATE (PF) 2 MG/ML IV SOLN: 1.0000 mg | INTRAVENOUS | Status: DC | PRN

## 2016-02-04 MED ORDER — OXYCODONE-ACETAMINOPHEN 5-325 MG PO TABS
ORAL_TABLET | ORAL | Status: AC
  Filled 2016-02-04: qty 2

## 2016-02-04 MED ORDER — FENTANYL CITRATE (PF) 250 MCG/5ML IJ SOLN
INTRAMUSCULAR | Status: DC | PRN
  Administered 2016-02-04: 50 ug via INTRAVENOUS
  Administered 2016-02-04 (×2): 100 ug via INTRAVENOUS
  Administered 2016-02-04 (×4): 50 ug via INTRAVENOUS
  Administered 2016-02-04: 100 ug via INTRAVENOUS
  Administered 2016-02-04: 50 ug via INTRAVENOUS

## 2016-02-04 MED ORDER — DEXAMETHASONE SODIUM PHOSPHATE 4 MG/ML IJ SOLN
INTRAMUSCULAR | Status: DC | PRN
  Administered 2016-02-04: 10 mg via INTRAVENOUS

## 2016-02-04 MED ORDER — HYDROMORPHONE HCL 1 MG/ML IJ SOLN
INTRAMUSCULAR | Status: AC
  Filled 2016-02-04: qty 1

## 2016-02-04 SURGICAL SUPPLY — 72 items
APL SKNCLS STERI-STRIP NONHPOA (GAUZE/BANDAGES/DRESSINGS) ×1
BAG DECANTER FOR FLEXI CONT (MISCELLANEOUS) ×3 IMPLANT
BENZOIN TINCTURE PRP APPL 2/3 (GAUZE/BANDAGES/DRESSINGS) ×3 IMPLANT
BLADE CLIPPER SURG (BLADE) ×2 IMPLANT
BRUSH SCRUB EZ PLAIN DRY (MISCELLANEOUS) ×3 IMPLANT
BUR MATCHSTICK NEURO 3.0 LAGG (BURR) ×3 IMPLANT
BUR PRECISION FLUTE 6.0 (BURR) ×3 IMPLANT
CANISTER SUCT 3000ML PPV (MISCELLANEOUS) ×3 IMPLANT
CAP REVERE LOCKING (Cap) ×8 IMPLANT
CLOSURE WOUND 1/2 X4 (GAUZE/BANDAGES/DRESSINGS) ×1
CONT SPEC 4OZ CLIKSEAL STRL BL (MISCELLANEOUS) ×3 IMPLANT
COVER BACK TABLE 60X90IN (DRAPES) ×3 IMPLANT
DRAPE C-ARM 42X72 X-RAY (DRAPES) ×6 IMPLANT
DRAPE LAPAROTOMY 100X72X124 (DRAPES) ×3 IMPLANT
DRAPE POUCH INSTRU U-SHP 10X18 (DRAPES) ×3 IMPLANT
DRAPE PROXIMA HALF (DRAPES) ×3 IMPLANT
DRAPE SURG 17X23 STRL (DRAPES) ×12 IMPLANT
ELECT BLADE 4.0 EZ CLEAN MEGAD (MISCELLANEOUS) ×6
ELECT REM PT RETURN 9FT ADLT (ELECTROSURGICAL) ×3
ELECTRODE BLDE 4.0 EZ CLN MEGD (MISCELLANEOUS) ×1 IMPLANT
ELECTRODE REM PT RTRN 9FT ADLT (ELECTROSURGICAL) ×1 IMPLANT
EVACUATOR 1/8 PVC DRAIN (DRAIN) IMPLANT
GAUZE SPONGE 4X4 12PLY STRL (GAUZE/BANDAGES/DRESSINGS) ×3 IMPLANT
GAUZE SPONGE 4X4 16PLY XRAY LF (GAUZE/BANDAGES/DRESSINGS) ×1 IMPLANT
GLOVE BIO SURGEON STRL SZ 6.5 (GLOVE) ×1 IMPLANT
GLOVE BIO SURGEON STRL SZ8 (GLOVE) ×10 IMPLANT
GLOVE BIO SURGEON STRL SZ8.5 (GLOVE) ×6 IMPLANT
GLOVE BIO SURGEONS STRL SZ 6.5 (GLOVE) ×1
GLOVE BIOGEL PI IND STRL 6.5 (GLOVE) IMPLANT
GLOVE BIOGEL PI IND STRL 7.0 (GLOVE) IMPLANT
GLOVE BIOGEL PI IND STRL 8.5 (GLOVE) IMPLANT
GLOVE BIOGEL PI INDICATOR 6.5 (GLOVE) ×2
GLOVE BIOGEL PI INDICATOR 7.0 (GLOVE) ×6
GLOVE BIOGEL PI INDICATOR 8.5 (GLOVE) ×2
GLOVE EXAM NITRILE LRG STRL (GLOVE) IMPLANT
GLOVE EXAM NITRILE MD LF STRL (GLOVE) IMPLANT
GLOVE EXAM NITRILE XL STR (GLOVE) IMPLANT
GLOVE EXAM NITRILE XS STR PU (GLOVE) IMPLANT
GLOVE SURG SS PI 6.5 STRL IVOR (GLOVE) ×4 IMPLANT
GOWN STRL REUS W/ TWL LRG LVL3 (GOWN DISPOSABLE) IMPLANT
GOWN STRL REUS W/ TWL XL LVL3 (GOWN DISPOSABLE) ×2 IMPLANT
GOWN STRL REUS W/TWL 2XL LVL3 (GOWN DISPOSABLE) IMPLANT
GOWN STRL REUS W/TWL LRG LVL3 (GOWN DISPOSABLE) ×6
GOWN STRL REUS W/TWL XL LVL3 (GOWN DISPOSABLE) ×6
KIT BASIN OR (CUSTOM PROCEDURE TRAY) ×3 IMPLANT
KIT ROOM TURNOVER OR (KITS) ×3 IMPLANT
NDL HYPO 21X1.5 SAFETY (NEEDLE) IMPLANT
NEEDLE HYPO 21X1.5 SAFETY (NEEDLE) ×3 IMPLANT
NEEDLE HYPO 22GX1.5 SAFETY (NEEDLE) ×3 IMPLANT
NS IRRIG 1000ML POUR BTL (IV SOLUTION) ×3 IMPLANT
PACK LAMINECTOMY NEURO (CUSTOM PROCEDURE TRAY) ×3 IMPLANT
PAD ARMBOARD 7.5X6 YLW CONV (MISCELLANEOUS) ×13 IMPLANT
PATTIES SURGICAL .5 X1 (DISPOSABLE) IMPLANT
PENCIL BUTTON HOLSTER BLD 10FT (ELECTRODE) ×2 IMPLANT
ROD REVERE CURVED 6.35X35MM (Rod) ×4 IMPLANT
SCREW 7.5X45MM (Screw) ×4 IMPLANT
SCREW 7.5X50MM (Screw) ×4 IMPLANT
SPACER ALTERA 10X31 8-12MM-8 (Spacer) ×2 IMPLANT
SPONGE LAP 4X18 X RAY DECT (DISPOSABLE) IMPLANT
SPONGE NEURO XRAY DETECT 1X3 (DISPOSABLE) IMPLANT
SPONGE SURGIFOAM ABS GEL 100 (HEMOSTASIS) ×3 IMPLANT
STRIP BIOACTIVE 20CC 25X100X8 (Miscellaneous) ×2 IMPLANT
STRIP BIOACTIVE 5CC 25X50X4MM (Miscellaneous) ×2 IMPLANT
STRIP CLOSURE SKIN 1/2X4 (GAUZE/BANDAGES/DRESSINGS) ×2 IMPLANT
SUT VIC AB 1 CT1 18XBRD ANBCTR (SUTURE) ×2 IMPLANT
SUT VIC AB 1 CT1 8-18 (SUTURE) ×6
SUT VIC AB 2-0 CP2 18 (SUTURE) ×6 IMPLANT
TAPE CLOTH SURG 4X10 WHT LF (GAUZE/BANDAGES/DRESSINGS) ×2 IMPLANT
TOWEL OR 17X24 6PK STRL BLUE (TOWEL DISPOSABLE) ×3 IMPLANT
TOWEL OR 17X26 10 PK STRL BLUE (TOWEL DISPOSABLE) ×3 IMPLANT
TRAY FOLEY W/METER SILVER 16FR (SET/KITS/TRAYS/PACK) ×3 IMPLANT
WATER STERILE IRR 1000ML POUR (IV SOLUTION) ×3 IMPLANT

## 2016-02-04 NOTE — Op Note (Signed)
Brief history: The patient is a 60 year old white male who's had lumbar surgery by another physician about a year ago. He's had persistent back pain. He has failed medical management and was worked up with a lumbar MRI. This demonstrated degenerative disc disease, facet arthropathy, etc. at L5-S1. I discussed situation with patient. We discussed the various treatment options. He has decided to proceed with an L5-S1 decompression, instrumentation, and fusion after weighing the risks, benefits, and alternatives surgery.  Preoperative diagnosis: L5-S1 degenerative disc disease disease, lumbago, lumbar radiculopathy, facet arthropathy  Postoperative diagnosis: As above  Procedure: Left L5-S1 Laminotomy/foraminotomies; L5-S1 transforaminal lumbar interbody fusion with local morselized autograft bone and Kinnex graft extender; insertion of interbody prosthesis at L5-S1 (globus peek expandable interbody prosthesis); posterior nonsegmental instrumentation from L5 to S1 with globus titanium pedicle screws and rods; posterior lateral arthrodesis at L5-S1 with local morselized autograft bone and Kinnex bone graft extender.  Surgeon: Dr. Earle Gell  Asst.: Dr. Erline Levine  Anesthesia: Gen. endotracheal  Estimated blood loss: 200 mL  Drains: None  Complications: None  Description of procedure: The patient was brought to the operating room by the anesthesia team. General endotracheal anesthesia was induced. The patient was turned to the prone position on the Wilson frame. The patient's lumbosacral region was then prepared with Betadine scrub and Betadine solution. Sterile drapes were applied.  I then injected the area to be incised with Marcaine with epinephrine solution. I then used the scalpel to make a linear midline incision over the L5-S1 interspace. I then used electrocautery to perform a bilateral subperiosteal dissection exposing the spinous process and lamina of L5 and S1. We then obtained  intraoperative radiograph to confirm our location. We then inserted the Verstrac retractor to provide exposure.  I began the decompression by using the high speed drill to perform laminotomies at L5-S1 on the left. We then used the Kerrison punches to widen the laminotomy and removed the ligamentum flavum at L5-S1 on the left. We used the Kerrison punches to remove the medial facets at L5-S1 on the left.   We now turned our attention to the transforaminal lumbar interbody fusion. I used a scalpel to incise the intervertebral disc at L5-S1 on the left. I then performed a partial intervertebral discectomy at L5-S1 using the pituitary forceps. We prepared the vertebral endplates at 075-GRM for the fusion by removing the soft tissues with the curettes. We then used the trial spacers to pick the appropriate sized interbody prosthesis. We prefilled his prosthesis with a combination of local morselized autograft bone that we obtained during the decompression as well as Kinnex bone graft extender. We inserted the prefilled prosthesis into the interspace at L5-S1 from the left, and then expanded the prosthesis. There was a good snug fit of the prosthesis in the interspace. We then filled and the remainder of the intervertebral disc space with local morselized autograft bone and Kinnex. This completed the posterior lumbar interbody arthrodesis.  We now turned attention to the instrumentation. Under fluoroscopic guidance we cannulated the bilateral L5-S1 pedicles with the bone probe. We then removed the bone probe. We then tapped the pedicle with a 6.5 millimeter tap. We then removed the tap. We probed inside the tapped pedicle with a ball probe to rule out cortical breaches. We then inserted a 7.5 x 45 and 50 millimeter pedicle screw into the L5 and S1 pedicles bilaterally under fluoroscopic guidance. We then palpated along the medial aspect of the pedicles on the left to rule out  cortical breaches. There were none. The  nerve roots were not injured. We then connected the unilateral pedicle screws with a lordotic rod. We compressed the construct and secured the rod in place with the caps. We then tightened the caps appropriately. This completed the instrumentation from L5-S1.  We now turned our attention to the posterior lateral arthrodesis at L5-S1. We used the high-speed drill to decorticate the remainder of the facets, pars, transverse process at L5-S1. We then applied a combination of local morselized autograft bone and Kinnex bone graft extender over these decorticated posterior lateral structures. This completed the posterior lateral arthrodesis.  We then obtained hemostasis using bipolar electrocautery. We irrigated the wound out with bacitracin solution. We inspected the thecal sac and nerve roots and noted they were well decompressed. We then removed the retractor. We placed vancomycin powder in the wound. We reapproximated patient's thoracolumbar fascia with interrupted #1 Vicryl suture. We reapproximated patient's subcutaneous tissue with interrupted 2-0 Vicryl suture. The reapproximated patient's skin with Steri-Strips and benzoin. The wound was then coated with bacitracin ointment. A sterile dressing was applied. The drapes were removed. The patient was subsequently returned to the supine position where they were extubated by the anesthesia team. He was then transported to the post anesthesia care unit in stable condition. All sponge instrument and needle counts were reportedly correct at the end of this case.

## 2016-02-04 NOTE — Anesthesia Preprocedure Evaluation (Addendum)
Anesthesia Evaluation  Patient identified by MRN, date of birth, ID band Patient awake    Reviewed: Allergy & Precautions, NPO status , Patient's Chart, lab work & pertinent test results  History of Anesthesia Complications (+) Emergence Delirium and history of anesthetic complications  Airway Mallampati: I  TM Distance: >3 FB Neck ROM: Full    Dental  (+) Caps,    Pulmonary neg pulmonary ROS,    Pulmonary exam normal breath sounds clear to auscultation       Cardiovascular Exercise Tolerance: Good hypertension, Pt. on medications Normal cardiovascular exam Rhythm:Regular Rate:Normal  EKG 01/28/16: NSR. Inferior infarct, age undetermined. No significant change from previous EKG 04/17/13 per Dr. Elmarie Shiley interpretation.   Nuclear stress test 03/30/15 [facility] hospital): Negative nuclear stress test   Neuro/Psych negative neurological ROS  negative psych ROS   GI/Hepatic Neg liver ROS, Hx/o diverticulitis with colon resection   Endo/Other  Obesity  Renal/GU negative Renal ROS  negative genitourinary   Musculoskeletal  (+) Arthritis , Lumbosacral DDD   Abdominal (+) + obese,   Peds  Hematology negative hematology ROS (+)   Anesthesia Other Findings   Reproductive/Obstetrics                          Anesthesia Physical Anesthesia Plan  ASA: II  Anesthesia Plan: General   Post-op Pain Management:    Induction: Intravenous  Airway Management Planned: Oral ETT  Additional Equipment:   Intra-op Plan:   Post-operative Plan: Extubation in OR  Informed Consent: I have reviewed the patients History and Physical, chart, labs and discussed the procedure including the risks, benefits and alternatives for the proposed anesthesia with the patient or authorized representative who has indicated his/her understanding and acceptance.   Dental advisory given  Plan Discussed with: CRNA,  Anesthesiologist and Surgeon  Anesthesia Plan Comments:         Anesthesia Quick Evaluation

## 2016-02-04 NOTE — Anesthesia Postprocedure Evaluation (Signed)
Anesthesia Post Note  Patient: James Simpson  Procedure(s) Performed: Procedure(s) (LRB): Lumbar Five-Sacral One Transforaminal lumbar interbody fusion/interbody prosthesis/posterior non segmental instrumentation/posterior lateral arthrodesis (N/A)  Patient location during evaluation: PACU Anesthesia Type: General Level of consciousness: awake, oriented, awake and alert and patient cooperative Pain management: pain level not controlled Vital Signs Assessment: post-procedure vital signs reviewed and stable Respiratory status: spontaneous breathing and respiratory function stable Cardiovascular status: blood pressure returned to baseline Anesthetic complications: no    Last Vitals:  Filed Vitals:   02/04/16 1835 02/04/16 1855  BP: 125/88 119/64  Pulse:  103  Temp:    Resp:  20    Last Pain:  Filed Vitals:   02/04/16 1856  PainSc: 5                  Eulanda Dorion EDWARD

## 2016-02-04 NOTE — Anesthesia Procedure Notes (Signed)
Procedure Name: Intubation Date/Time: 02/04/2016 2:14 PM Performed by: Ollen Bowl Pre-anesthesia Checklist: Patient identified, Emergency Drugs available, Suction available, Patient being monitored and Timeout performed Patient Re-evaluated:Patient Re-evaluated prior to inductionOxygen Delivery Method: Circle system utilized and Simple face mask Preoxygenation: Pre-oxygenation with 100% oxygen Intubation Type: IV induction Ventilation: Mask ventilation without difficulty and Oral airway inserted - appropriate to patient size Laryngoscope Size: Miller and 3 Grade View: Grade I Tube type: Oral Tube size: 8.0 mm Number of attempts: 1 Airway Equipment and Method: Patient positioned with wedge pillow and Stylet Placement Confirmation: ETT inserted through vocal cords under direct vision,  positive ETCO2 and breath sounds checked- equal and bilateral Secured at: 22 cm Tube secured with: Tape Dental Injury: Teeth and Oropharynx as per pre-operative assessment

## 2016-02-04 NOTE — Transfer of Care (Signed)
Immediate Anesthesia Transfer of Care Note  Patient: James Simpson  Procedure(s) Performed: Procedure(s) with comments: Lumbar Five-Sacral One Transforaminal lumbar interbody fusion/interbody prosthesis/posterior non segmental instrumentation/posterior lateral arthrodesis (N/A) - Lumbar five-Sacral One Transforaminal lumbar interbody fusion/interbody prosthesis/posterior non segmental instrumentation/posterior lateral arthrodesis  Patient Location: PACU  Anesthesia Type:General  Level of Consciousness: sedated and lethargic  Airway & Oxygen Therapy: Patient Spontanous Breathing and Patient connected to face mask oxygen  Post-op Assessment: Report given to RN, Post -op Vital signs reviewed and stable and Patient moving all extremities X 4  Post vital signs: Reviewed and stable  Last Vitals:  Filed Vitals:   02/04/16 0849 02/04/16 1807  BP: 114/72 147/83  Pulse: 83   Temp: 36.8 C 36.3 C  Resp: 18 19    Last Pain:  Filed Vitals:   02/04/16 1822  PainSc: 8       Patients Stated Pain Goal: 4 ([id number])  Complications: No apparent anesthesia complications

## 2016-02-04 NOTE — H&P (Signed)
Subjective: The patient is a 60 year old white male who has had prior back surgery by another physician about a year ago. He's had persistent back and leg pain. He has failed medical management. He was worked up with a lumbar MRI which demonstrated degenerative disc disease, facet arthropathy, cervical cyst, etc. at L5-S1. I discussed the situation with the patient. We discussed the various treatment options. The patient has decided to proceed with surgery.   Past Medical History  Diagnosis Date  . Diverticulitis     2007  . Complication of anesthesia     pt was combative with right rotator cuff  . DDD (degenerative disc disease)   . Hypertension     Past Surgical History  Procedure Laterality Date  . Rt rotator cuff      shoulder  . Hernia repair Right     child  . Colon resection N/A 04/22/2013    Procedure: LAPAROSCOPIC HAND ASSISTED PARTIAL COLECTOMY ;  Surgeon: Jamesetta So, MD;  Location: [facility];  Service: General;  Laterality: N/A;    Allergies  Allergen Reactions  . Penicillins Other (See Comments)    Has patient had a PCN reaction causing immediate rash, facial/tongue/throat swelling, SOB or lightheadedness with hypotension: Unknown, childhood reaction Has patient had a PCN reaction causing severe rash involving mucus membranes or skin necrosis: No Has patient had a PCN reaction that required hospitalization No Has patient had a PCN reaction occurring within the last 10 years: No If all of the above answers are "NO", then may proceed with Cephalosporin use.    Social History  Substance Use Topics  . Smoking status: Never Smoker   . Smokeless tobacco: Not on file  . Alcohol Use: No     Comment: rarely    History reviewed. No pertinent family history. Prior to Admission medications   Medication Sig Start Date End Date Taking? Authorizing Provider  ALPRAZolam Duanne Moron) 1 MG tablet Take 1 mg by mouth 2 (two) times daily.   Yes Historical Provider, MD  Cholecalciferol  (VITAMIN D PO) Take 1 tablet by mouth daily.   Yes Historical Provider, MD  escitalopram (LEXAPRO) 20 MG tablet Take 20 mg by mouth daily.    Yes Historical Provider, MD  gabapentin (NEURONTIN) 800 MG tablet Take 800 mg by mouth every 6 (six) hours as needed (for pain).  01/12/16  Yes Historical Provider, MD  HYDROmorphone (DILAUDID) 4 MG tablet Take 4 mg by mouth every 4 (four) hours as needed for severe pain.   Yes Historical Provider, MD  ibuprofen (ADVIL,MOTRIN) 800 MG tablet Take 800 mg by mouth every 8 (eight) hours as needed for moderate pain.    Yes Historical Provider, MD  lisinopril-hydrochlorothiazide (PRINZIDE,ZESTORETIC) 20-25 MG tablet Take 1 tablet by mouth daily. 11/06/15  Yes Historical Provider, MD  Multiple Vitamin (MULTIVITAMIN) tablet Take 1 tablet by mouth daily.   Yes Historical Provider, MD  tiZANidine (ZANAFLEX) 4 MG tablet Take 4 mg by mouth every 8 (eight) hours as needed for muscle spasms.   Yes Historical Provider, MD     Review of Systems  Positive ROS: As above  All other systems have been reviewed and were otherwise negative with the exception of those mentioned in the HPI and as above.  Objective: Vital signs in last 24 hours: Temp:  [98.3 F (36.8 C)] 98.3 F (36.8 C) (06/08 0849) Pulse Rate:  [83] 83 (06/08 0849) Resp:  [18] 18 (06/08 0849) BP: (114)/(72) 114/72 mmHg (06/08 0849) SpO2:  [  98 %] 98 % (06/08 0849)  General Appearance: Alert, cooperative, no distress, Head: Normocephalic, without obvious abnormality, atraumatic Eyes: PERRL, conjunctiva/corneas clear, EOM's intact,    Ears: Normal  Throat: Normal  Neck: Supple, symmetrical, trachea midline, no adenopathy; thyroid: No enlargement/tenderness/nodules; no carotid bruit or JVD Back: Symmetric, no curvature, ROM normal, no CVA tenderness. The patient's lumbar incision is well-healed. Lungs: Clear to auscultation bilaterally, respirations unlabored Heart: Regular rate and rhythm, no murmur, rub  or gallop Abdomen: Soft, non-tender,, no masses, no organomegaly Extremities: Extremities normal, atraumatic, no cyanosis or edema Pulses: 2+ and symmetric all extremities Skin: Skin color, texture, turgor normal, no rashes or lesions  NEUROLOGIC:   Mental status: alert and oriented, no aphasia, good attention span, Fund of knowledge/ memory ok Motor Exam - grossly normal Sensory Exam - grossly normal Reflexes:  Coordination - grossly normal Gait - grossly normal Balance - grossly normal Cranial Nerves: I: smell Not tested  II: visual acuity  OS: Normal  OD: Normal   II: visual fields Full to confrontation  II: pupils Equal, round, reactive to light  III,VII: ptosis None  III,IV,VI: extraocular muscles  Full ROM  V: mastication Normal  V: facial light touch sensation  Normal  V,VII: corneal reflex  Present  VII: facial muscle function - upper  Normal  VII: facial muscle function - lower Normal  VIII: hearing Not tested  IX: soft palate elevation  Normal  IX,X: gag reflex Present  XI: trapezius strength  5/5  XI: sternocleidomastoid strength 5/5  XI: neck flexion strength  5/5  XII: tongue strength  Normal    Data Review Lab Results  Component Value Date   WBC 6.7 01/28/2016   HGB 14.5 01/28/2016   HCT 43.9 01/28/2016   MCV 92.2 01/28/2016   PLT 161 01/28/2016   Lab Results  Component Value Date   NA 139 01/28/2016   K 3.9 01/28/2016   CL 105 01/28/2016   CO2 29 01/28/2016   BUN 15 01/28/2016   CREATININE 1.24 01/28/2016   GLUCOSE 97 01/28/2016   No results found for: INR, PROTIME  Assessment/Plan: L5-S1 degenerative disc disease, facet arthropathy, lumbago, lumbar radiculopathy: I have discussed the situation with the patient and reviewed his imaging studies with him. We have discussed the various treatment options including surgery. I have described the surgical treatment option of an L5-S1 decompression, instrumentation, and fusion. I have shown him  surgical models. We have discussed the risks, benefits, alternatives, and likelihood of achieving her goals with surgery. I have answered all the patient's questions. He has decided to proceed with surgery.   Tanielle Emigh D 02/04/2016 1:30 PM

## 2016-02-05 LAB — CBC
HEMATOCRIT: 37.2 % — AB (ref 39.0–52.0)
HEMOGLOBIN: 12.5 g/dL — AB (ref 13.0–17.0)
MCH: 30.7 pg (ref 26.0–34.0)
MCHC: 33.6 g/dL (ref 30.0–36.0)
MCV: 91.4 fL (ref 78.0–100.0)
Platelets: 124 10*3/uL — ABNORMAL LOW (ref 150–400)
RBC: 4.07 MIL/uL — AB (ref 4.22–5.81)
RDW: 12.2 % (ref 11.5–15.5)
WBC: 10.9 10*3/uL — ABNORMAL HIGH (ref 4.0–10.5)

## 2016-02-05 LAB — BASIC METABOLIC PANEL
Anion gap: 6 (ref 5–15)
BUN: 21 mg/dL — ABNORMAL HIGH (ref 6–20)
CHLORIDE: 103 mmol/L (ref 101–111)
CO2: 25 mmol/L (ref 22–32)
Calcium: 8.6 mg/dL — ABNORMAL LOW (ref 8.9–10.3)
Creatinine, Ser: 1.38 mg/dL — ABNORMAL HIGH (ref 0.61–1.24)
GFR calc non Af Amer: 54 mL/min — ABNORMAL LOW (ref >60)
Glucose, Bld: 146 mg/dL — ABNORMAL HIGH (ref 65–99)
POTASSIUM: 5 mmol/L (ref 3.5–5.1)
SODIUM: 134 mmol/L — AB (ref 135–145)

## 2016-02-05 MED ORDER — ALPRAZOLAM 0.5 MG PO TABS: 1.0000 mg | ORAL_TABLET | Freq: Two times a day (BID) | ORAL | Status: DC | PRN

## 2016-02-05 MED FILL — Sodium Chloride IV Soln 0.9%: INTRAVENOUS | Qty: 2000 | Status: AC

## 2016-02-05 MED FILL — Heparin Sodium (Porcine) Inj 1000 Unit/ML: INTRAMUSCULAR | Qty: 30 | Status: AC

## 2016-02-05 NOTE — Care Management Note (Signed)
Case Management Note  Patient Details  Name: CHRISTOPHERMICH SENDEJO MRN: [id number] Date of Birth: [birth year]  Subjective/Objective:     Patient underwent:  Lumbar Five-Sacral One Transforaminal lumbar interbody fusion/interbody prosthesis/posterior non segmental instrumentation/posterior lateral arthrodesis. He is from home with his spouse.  Action/Plan: Awaiting PT/OT recommendations. CM following for discharge needs.   Expected Discharge Date:  02/07/16               Expected Discharge Plan:     In-House Referral:     Discharge planning Services     Post Acute Care Choice:    Choice offered to:     DME Arranged:    DME Agency:     HH Arranged:    HH Agency:     Status of Service:  In process, will continue to follow  Medicare Important Message Given:    Date Medicare IM Given:    Medicare IM give by:    Date Additional Medicare IM Given:    Additional Medicare Important Message give by:     If discussed at [place] of Stay Meetings, dates discussed:    Additional Comments:  Pollie Friar, RN 02/05/2016, 11:03 AM

## 2016-02-05 NOTE — Progress Notes (Signed)
Removed patient urinary catheter slowly and steadily. Pt states tolerated well.  Later, patient joking that nurse removed catheter by "jerking it out so fast".  Actual removal without incident. James Simpson

## 2016-02-05 NOTE — Evaluation (Signed)
Physical Therapy Evaluation Patient Details Name: James Simpson MRN: [id number] DOB: [birth year] Today's Date: 02/05/2016   History of Present Illness  post Left L5-S1 Laminotomy/foraminotomies; L5-S1 transforaminal lumbar interbody fusion ,bone gaft; insertion of interbody prosthesis at L5-S1 ; posterior nonsegmental instrumentation from L5 to S1with pedicle screws and rods; posterior lateral arthrodesis at L5-S1 with  autograft bone and Kinnex bone graft extender. on 02/04/16  Clinical Impression  Patient very motivated to mobilize today. Brace applied after sitting. Bed mobility was the most difficult. Once upright, ambulated with minimal support of the RW. Pt admitted with above diagnosis. Pt currently with functional limitations due to the deficits listed below (see PT Problem List). Pt will benefit from skilled PT to increase their independence and safety with mobility to allow discharge to the venue listed below.       Follow Up Recommendations No PT follow up    Equipment Recommendations  None recommended by PT    Recommendations for Other Services       Precautions / Restrictions Precautions Precautions: Back Required Braces or Orthoses: Spinal Brace Spinal Brace: Lumbar corset;Applied in supine position (orders no specific, placed on in sitting.)      Mobility  Bed Mobility Overal bed mobility: Needs Assistance Bed Mobility: Rolling;Sidelying to Sit;Sit to Sidelying Rolling: Mod assist Sidelying to sit: Mod assist     Sit to sidelying: Mod assist General bed mobility comments: cues for log roll and for back precautions.  Transfers Overall transfer level: Needs assistance Equipment used: Rolling walker (2 wheeled) Transfers: Sit to/from Stand Sit to Stand: Min guard         General transfer comment: extra time to power up. Patient did not want PT assist. cues for technique  Ambulation/Gait Ambulation/Gait assistance: Min assist Ambulation Distance (Feet): 220  Feet Assistive device: Rolling walker (2 wheeled) Gait Pattern/deviations: Step-to pattern     General Gait Details: initially leans onto RW. gradually able to barely use UE for support.  Stairs            Wheelchair Mobility    Modified Rankin (Stroke Patients Only)       Balance                                             Pertinent Vitals/Pain Pain Assessment: 0-10 Pain Score: 10-Worst pain ever Pain Location: 12 back  Pain Descriptors / Indicators: Cramping;Discomfort;Grimacing;Guarding;Heaviness;Spasm Pain Intervention(s): Limited activity within patient's tolerance;Monitored during session;Patient requesting pain meds-RN notified;Repositioned    Home Living Family/patient expects to be discharged to:: Private residence Living Arrangements: Spouse/significant other Available Help at Discharge: Family Type of Home: House Home Access: Stairs to enter Entrance Stairs-Rails: Psychiatric nurse of Steps: has a washingmachine on 1 side and cabineton the other that holds onto Home Layout: One level Home Equipment: Environmental consultant - 2 wheels;Bedside commode      Prior Function Level of [place]: Needs assistance   Gait / Transfers Assistance Needed: needed assist  2 days PTA, could barely ambulate           Hand Dominance        Extremity/Trunk Assessment   Upper Extremity Assessment: Overall WFL for tasks assessed           Lower Extremity Assessment: Overall WFL for tasks assessed         Communication   Communication: No difficulties  Cognition Arousal/Alertness: Awake/alert   Overall Cognitive Status: Within Functional Limits for tasks assessed                      General Comments      Exercises        Assessment/Plan    PT Assessment Patient needs continued PT services  PT Diagnosis Difficulty walking;Acute pain   PT Problem List Decreased activity tolerance;Decreased  mobility;Pain;Decreased safety awareness;Decreased knowledge of use of DME;Decreased knowledge of precautions  PT Treatment Interventions DME instruction;Gait training;Functional mobility training;Therapeutic activities;Therapeutic exercise   PT Goals (Current goals can be found in the Care Plan section) Acute Rehab PT Goals Patient Stated Goal: to walk, have no pain PT Goal Formulation: With patient/family Time For Goal Achievement: 02/07/16 Potential to Achieve Goals: Good    Frequency Min 5X/week   Barriers to discharge        Co-evaluation               End of Session Equipment Utilized During Treatment: Gait belt Activity Tolerance: Patient tolerated treatment well Patient left: in bed;with call bell/phone within reach;with family/visitor present Nurse Communication: Mobility status         Time: 1400-1433 PT Time Calculation (min) (ACUTE ONLY): 33 min   Charges:   PT Evaluation $PT Eval Low Complexity: 1 Procedure PT Treatments $Gait Training: 8-22 mins   PT G Codes:        Claretha Cooper 02/05/2016, 4:04 PM

## 2016-02-05 NOTE — Progress Notes (Signed)
PT Cancellation Note  Patient Details Name: James Simpson MRN: [id number] DOB: 1956/08/08   Cancelled Treatment:    Reason Eval/Treat Not Completed: Medical issues which prohibited therapy (Pt has no back brace and will check back later to evaluate ).  Pt is possibly discharging in the next day.   Ramond Dial 02/05/2016, 9:25 AM    Mee Hives, PT MS Acute Rehab Dept. Number: [place] [facility] [place]

## 2016-02-05 NOTE — Progress Notes (Signed)
Patient ID: James Simpson, male   DOB: March 05, 1956, 60 y.o.   MRN: [id number] Subjective:  The patient is alert and pleasant. He is in no apparent distress. His back is appropriately sore.  Objective: Vital signs in last 24 hours: Temp:  [97.3 F (36.3 C)-98.4 F (36.9 C)] 97.6 F (36.4 C) (06/09 0541) Pulse Rate:  [83-105] 93 (06/09 0541) Resp:  [11-20] 17 (06/09 0541) BP: (100-147)/(58-88) 100/58 mmHg (06/09 0541) SpO2:  [92 %-100 %] 97 % (06/09 0541) Weight:  [118.117 kg (260 lb 6.4 oz)] 118.117 kg (260 lb 6.4 oz) (06/08 2015)  Intake/Output from previous day: 06/08 0701 - 06/09 0700 In: 3240 [P.O.:240; I.V.:3000] Out: 1900 [Urine:1700; Blood:200] Intake/Output this shift:    Physical exam the patient is alert and oriented. He is moving his lower extremities well.  Lab Results:  Recent Labs  02/05/16 0350  WBC 10.9*  HGB 12.5*  HCT 37.2*  PLT 124*   BMET  Recent Labs  02/05/16 0350  NA 134*  K 5.0  CL 103  CO2 25  GLUCOSE 146*  BUN 21*  CREATININE 1.38*  CALCIUM 8.6*    Studies/Results: Dg Lumbar Spine 2-3 Views  02/04/2016  CLINICAL DATA:  Elective lumbar spine fusion. EXAM: DG C-ARM 61-120 MIN; LUMBAR SPINE - 2-3 VIEW COMPARISON:  None. FINDINGS: Intraoperative AP and lateral spot radiographs show placement of interbody cages and pedicle screws at level of L5-S1. IMPRESSION: PLIF hardware placement at L5-S1. Electronically Signed   By: Earle Gell M.D.   On: 02/04/2016 17:37   Dg Lumbar Spine 1 View  02/04/2016  CLINICAL DATA:  Posterior lumbar fusion EXAM: LUMBAR SPINE - 1 VIEW COMPARISON:  Lumbar spine MRI a 05/01/2015 FINDINGS: Single portable cross-table lateral view of the lumbar spine is provided. Posterior tissue retractors are present. Occurred tip metallic probe is identified with the tip projecting along the inferior margin of the L5 inferior articulating facet. IMPRESSION: Intraoperative localization as described above. Electronically Signed   By:  Kathreen Devoid   On: 02/04/2016 16:01   Dg C-arm 1-60 Min  02/04/2016  CLINICAL DATA:  Elective lumbar spine fusion. EXAM: DG C-ARM 61-120 MIN; LUMBAR SPINE - 2-3 VIEW COMPARISON:  None. FINDINGS: Intraoperative AP and lateral spot radiographs show placement of interbody cages and pedicle screws at level of L5-S1. IMPRESSION: PLIF hardware placement at L5-S1. Electronically Signed   By: Earle Gell M.D.   On: 02/04/2016 17:37    Assessment/Plan: Postop day #1: The patient is doing well. He may go home tomorrow. I gave him his discharge instructions and answered all his questions.  LOS: 1 day     Carliyah Cotterman D 02/05/2016, 7:53 AM

## 2016-02-06 NOTE — Progress Notes (Signed)
Pt d/c to home by car with family. Assessment stable. Prescriptions given by md. All questions answered

## 2016-02-06 NOTE — Discharge Summary (Signed)
Physician Discharge Summary  Patient ID: James Simpson MRN: [id number] DOB/AGE: 03/19/1956 24 y.o.  Admit date: 02/04/2016 Discharge date: 02/06/2016  Admission Diagnoses:lumbar degenerative disc disease  Discharge Diagnoses:  Active Problems:   Lumbar degenerative disc disease   Discharged Condition: ambulating  Hospital Course: surgery  Consults: none  Significant Diagnostic Studies: mri  Treatments: lumbar fusion  Discharge Exam: Blood pressure 152/66, pulse 88, temperature 98.8 F (37.1 C), temperature source Oral, resp. rate 18, height 5' 10.98" (1.803 m), weight 118.117 kg (260 lb 6.4 oz), SpO2 95 %. Ambulating. Wants to go home  Disposition: 01-Home or Self Care     Medication List    ASK your doctor about these medications        ALPRAZolam 1 MG tablet  Commonly known as:  XANAX  Take 1 mg by mouth 2 (two) times daily as needed for anxiety.     escitalopram 20 MG tablet  Commonly known as:  LEXAPRO  Take 20 mg by mouth daily.     gabapentin 800 MG tablet  Commonly known as:  NEURONTIN  Take 800 mg by mouth every 6 (six) hours as needed (for pain).     HYDROmorphone 4 MG tablet  Commonly known as:  DILAUDID  Take 4 mg by mouth every 4 (four) hours as needed for severe pain.     ibuprofen 800 MG tablet  Commonly known as:  ADVIL,MOTRIN  Take 800 mg by mouth every 8 (eight) hours as needed for moderate pain.     lisinopril-hydrochlorothiazide 20-25 MG tablet  Commonly known as:  PRINZIDE,ZESTORETIC  Take 1 tablet by mouth daily.     multivitamin tablet  Take 1 tablet by mouth daily.     tiZANidine 4 MG tablet  Commonly known as:  ZANAFLEX  Take 4 mg by mouth every 8 (eight) hours as needed for muscle spasms.     VITAMIN D PO  Take 1 tablet by mouth daily.         Signed: Floyce Stakes 02/06/2016, 9:15 AM

## 2016-02-06 NOTE — Progress Notes (Signed)
Physical Therapy Treatment Patient Details Name: James Simpson MRN: [id number] DOB: [birth year] Today's Date: 02/06/2016    History of Present Illness post Left L5-S1 Laminotomy/foraminotomies; L5-S1 transforaminal lumbar interbody fusion ,bone gaft; insertion of interbody prosthesis at L5-S1 ; posterior nonsegmental instrumentation from L5 to S1with pedicle screws and rods; posterior lateral arthrodesis at L5-S1 with  autograft bone and Kinnex bone graft extender. on 02/04/16    PT Comments    Pt progressing well with mobility.  Completed all mobility with supervision.  Verbalizes bed mobility is the most difficult for him but was able to complete with no physical assistance.  Pt eager to d/c home today.  Patient safe to D/C from a mobility standpoint based on progression towards goals set on PT eval.    Follow Up Recommendations  No PT follow up     Equipment Recommendations  None recommended by PT    Recommendations for Other Services       Precautions / Restrictions Precautions Precautions: Back Required Braces or Orthoses: Spinal Brace Spinal Brace: Lumbar corset;Applied in supine position    Mobility  Bed Mobility Overal bed mobility: Needs Assistance Bed Mobility: Rolling;Sidelying to Sit Rolling: Supervision Sidelying to sit: Supervision       General bed mobility comments: increased time and effortful due to pain but no physical assist needed.   Transfers Overall transfer level: Needs assistance Equipment used: Rolling walker (2 wheeled) Transfers: Sit to/from Stand Sit to Stand: Supervision         General transfer comment: cues for hand placement  Ambulation/Gait Ambulation/Gait assistance: Supervision Ambulation Distance (Feet): 400 Feet Assistive device: Rolling walker (2 wheeled) Gait Pattern/deviations: Step-through pattern;Decreased stride length     General Gait Details: cues to relax shoulders.  pt able to ambulate ~50' without AD but reports  increased back pain with no UE support.  Advised patient to continue to use RW to help with pain control during gt.  pt agreeable.    Stairs            Wheelchair Mobility    Modified Rankin (Stroke Patients Only)       Balance                                    Cognition Arousal/Alertness: Awake/alert   Overall Cognitive Status: Within Functional Limits for tasks assessed                      Exercises      General Comments        Pertinent Vitals/Pain Pain Assessment: 0-10 Pain Score: 8  Pain Location: back & groin area Pain Descriptors / Indicators: Aching Pain Intervention(s): Monitored during session;Premedicated before session;Repositioned;Relaxation;Limited activity within patient's tolerance    Home Living                      Prior Function            PT Goals (current goals can now be found in the care plan section) Acute Rehab PT Goals Patient Stated Goal: to walk, have no pain PT Goal Formulation: With patient/family Time For Goal Achievement: 02/07/16 Potential to Achieve Goals: Good Progress towards PT goals: Progressing toward goals    Frequency  Min 5X/week    PT Plan Current plan remains appropriate    Co-evaluation  End of Session Equipment Utilized During Treatment: Gait belt;Back brace Activity Tolerance: Patient tolerated treatment well Patient left: in chair;with call bell/phone within reach     Time: 0846-0912 PT Time Calculation (min) (ACUTE ONLY): 26 min  Charges:  $Gait Training: 8-22 mins $Therapeutic Activity: 8-22 mins                    G Codes:      Sena Hitch 02/06/2016, [place], PTA [phone] 02/06/2016

## 2016-02-06 NOTE — Discharge Instructions (Signed)
Spinal Fusion, Care After °Refer to this sheet in the next few weeks. These instructions provide you with information on caring for yourself after your procedure. Your caregiver may also give you more specific instructions. Your treatment has been planned according to current medical practices, but problems sometimes occur. Call your caregiver if you have any problems or questions after your procedure. °HOME CARE INSTRUCTIONS  °· Take whatever pain medicine has been prescribed by your caregiver. Do not take over-the-counter pain medicine unless directed otherwise by your caregiver. °· Do not drive if you are taking narcotic pain medicines. °· Change your bandage (dressing) if necessary or as directed by your caregiver. °· Do not get your surgical cut (incision) wet. After a few days you may take quick showers (rather than baths), but keep your incision clean and dry. Covering the incision with plastic wrap while you shower should keep your incision dry. A few weeks after surgery, once your incision has healed and your caregiver says it is okay, you can take baths or go swimming. °· If you have been prescribed medicine to prevent your blood from clotting, follow the directions carefully. °· Check the area around your incision often. Look for redness and swelling. Also, look for anything leaking from your wound. You can use a mirror or have a family member inspect your incision if it is in a place where it is difficult for you to see. °· Ask your caregiver what activities you should avoid and for how long. °· Walk as much as possible. °· Do not lift anything heavier than 10 pounds (4.5 kilograms) until your caregiver says it is safe. °· Do not twist or bend for a few weeks. Try not to pull on things. Avoid sitting for long periods of time. Change positions at least every hour. °· Ask your caregiver what kinds of exercise you should do to make your back stronger and when you should begin doing these exercises. °SEEK  IMMEDIATE MEDICAL CARE IF:  °· Pain suddenly becomes much worse. °· The incision area is red, swollen, bleeding, or leaking fluid. °· Your legs or feet become increasingly painful, numb, weak, or swollen. °· You have trouble controlling urination or bowel movements. °· You have trouble breathing. °· You have chest pain. °· You have a fever. °MAKE SURE YOU: °· Understand these instructions. °· Will watch your condition. °· Will get help right away if you are not doing well or get worse. °  °This information is not intended to replace advice given to you by your health care provider. Make sure you discuss any questions you have with your health care provider. °  °Document Released: 03/04/2005 Document Revised: 09/05/2014 Document Reviewed: 01/28/2015 °Elsevier Interactive Patient Education ©2016 [employer] ° °

## 2016-02-08 ENCOUNTER — Encounter (INDEPENDENT_AMBULATORY_CARE_PROVIDER_SITE_OTHER): Payer: Self-pay | Admitting: *Deleted

## 2016-02-23 DIAGNOSIS — M5137 Other intervertebral disc degeneration, lumbosacral region: Secondary | ICD-10-CM | POA: Diagnosis not present

## 2016-02-23 DIAGNOSIS — Z6835 Body mass index (BMI) 35.0-35.9, adult: Secondary | ICD-10-CM | POA: Diagnosis not present

## 2016-02-23 DIAGNOSIS — M545 Low back pain: Secondary | ICD-10-CM | POA: Diagnosis not present

## 2016-03-11 DIAGNOSIS — M79606 Pain in leg, unspecified: Secondary | ICD-10-CM | POA: Diagnosis not present

## 2016-03-11 DIAGNOSIS — M542 Cervicalgia: Secondary | ICD-10-CM | POA: Diagnosis not present

## 2016-03-11 DIAGNOSIS — M549 Dorsalgia, unspecified: Secondary | ICD-10-CM | POA: Diagnosis not present

## 2016-03-11 DIAGNOSIS — M5136 Other intervertebral disc degeneration, lumbar region: Secondary | ICD-10-CM | POA: Diagnosis not present

## 2016-03-11 DIAGNOSIS — K5792 Diverticulitis of intestine, part unspecified, without perforation or abscess without bleeding: Secondary | ICD-10-CM | POA: Diagnosis not present

## 2016-03-11 DIAGNOSIS — Z79891 Long term (current) use of opiate analgesic: Secondary | ICD-10-CM | POA: Diagnosis not present

## 2016-03-11 DIAGNOSIS — M5412 Radiculopathy, cervical region: Secondary | ICD-10-CM | POA: Diagnosis not present

## 2016-05-10 DIAGNOSIS — M549 Dorsalgia, unspecified: Secondary | ICD-10-CM | POA: Diagnosis not present

## 2016-05-10 DIAGNOSIS — Z79891 Long term (current) use of opiate analgesic: Secondary | ICD-10-CM | POA: Diagnosis not present

## 2016-05-10 DIAGNOSIS — M542 Cervicalgia: Secondary | ICD-10-CM | POA: Diagnosis not present

## 2016-05-10 DIAGNOSIS — M5412 Radiculopathy, cervical region: Secondary | ICD-10-CM | POA: Diagnosis not present

## 2016-05-10 DIAGNOSIS — K5792 Diverticulitis of intestine, part unspecified, without perforation or abscess without bleeding: Secondary | ICD-10-CM | POA: Diagnosis not present

## 2016-05-10 DIAGNOSIS — M79606 Pain in leg, unspecified: Secondary | ICD-10-CM | POA: Diagnosis not present

## 2016-05-10 DIAGNOSIS — M5136 Other intervertebral disc degeneration, lumbar region: Secondary | ICD-10-CM | POA: Diagnosis not present

## 2016-05-18 DIAGNOSIS — L218 Other seborrheic dermatitis: Secondary | ICD-10-CM | POA: Diagnosis not present

## 2016-05-18 DIAGNOSIS — L821 Other seborrheic keratosis: Secondary | ICD-10-CM | POA: Diagnosis not present

## 2016-05-18 DIAGNOSIS — I781 Nevus, non-neoplastic: Secondary | ICD-10-CM | POA: Diagnosis not present

## 2016-06-14 DIAGNOSIS — I10 Essential (primary) hypertension: Secondary | ICD-10-CM | POA: Diagnosis not present

## 2016-06-14 DIAGNOSIS — M545 Low back pain: Secondary | ICD-10-CM | POA: Diagnosis not present

## 2016-06-14 DIAGNOSIS — M5137 Other intervertebral disc degeneration, lumbosacral region: Secondary | ICD-10-CM | POA: Diagnosis not present

## 2016-06-14 DIAGNOSIS — M5416 Radiculopathy, lumbar region: Secondary | ICD-10-CM | POA: Diagnosis not present

## 2016-06-14 DIAGNOSIS — Z6836 Body mass index (BMI) 36.0-36.9, adult: Secondary | ICD-10-CM | POA: Diagnosis not present

## 2016-06-20 DIAGNOSIS — M5416 Radiculopathy, lumbar region: Secondary | ICD-10-CM | POA: Diagnosis not present

## 2016-06-30 DIAGNOSIS — M5126 Other intervertebral disc displacement, lumbar region: Secondary | ICD-10-CM | POA: Diagnosis not present

## 2016-06-30 DIAGNOSIS — M5416 Radiculopathy, lumbar region: Secondary | ICD-10-CM | POA: Diagnosis not present

## 2016-07-07 DIAGNOSIS — K5792 Diverticulitis of intestine, part unspecified, without perforation or abscess without bleeding: Secondary | ICD-10-CM | POA: Diagnosis not present

## 2016-07-07 DIAGNOSIS — Z79891 Long term (current) use of opiate analgesic: Secondary | ICD-10-CM | POA: Diagnosis not present

## 2016-07-07 DIAGNOSIS — M549 Dorsalgia, unspecified: Secondary | ICD-10-CM | POA: Diagnosis not present

## 2016-07-07 DIAGNOSIS — M5136 Other intervertebral disc degeneration, lumbar region: Secondary | ICD-10-CM | POA: Diagnosis not present

## 2016-07-07 DIAGNOSIS — M542 Cervicalgia: Secondary | ICD-10-CM | POA: Diagnosis not present

## 2016-07-07 DIAGNOSIS — M79606 Pain in leg, unspecified: Secondary | ICD-10-CM | POA: Diagnosis not present

## 2016-07-07 DIAGNOSIS — M5412 Radiculopathy, cervical region: Secondary | ICD-10-CM | POA: Diagnosis not present

## 2016-07-12 DIAGNOSIS — M545 Low back pain: Secondary | ICD-10-CM | POA: Diagnosis not present

## 2016-07-12 DIAGNOSIS — M4696 Unspecified inflammatory spondylopathy, lumbar region: Secondary | ICD-10-CM | POA: Diagnosis not present

## 2016-07-12 DIAGNOSIS — Z6836 Body mass index (BMI) 36.0-36.9, adult: Secondary | ICD-10-CM | POA: Diagnosis not present

## 2016-07-12 DIAGNOSIS — I10 Essential (primary) hypertension: Secondary | ICD-10-CM | POA: Diagnosis not present

## 2016-07-18 DIAGNOSIS — Z23 Encounter for immunization: Secondary | ICD-10-CM | POA: Diagnosis not present

## 2016-09-05 DIAGNOSIS — Z79891 Long term (current) use of opiate analgesic: Secondary | ICD-10-CM | POA: Diagnosis not present

## 2016-09-05 DIAGNOSIS — M5136 Other intervertebral disc degeneration, lumbar region: Secondary | ICD-10-CM | POA: Diagnosis not present

## 2016-09-05 DIAGNOSIS — K5792 Diverticulitis of intestine, part unspecified, without perforation or abscess without bleeding: Secondary | ICD-10-CM | POA: Diagnosis not present

## 2016-09-05 DIAGNOSIS — M5412 Radiculopathy, cervical region: Secondary | ICD-10-CM | POA: Diagnosis not present

## 2016-09-05 DIAGNOSIS — M79606 Pain in leg, unspecified: Secondary | ICD-10-CM | POA: Diagnosis not present

## 2016-09-05 DIAGNOSIS — M542 Cervicalgia: Secondary | ICD-10-CM | POA: Diagnosis not present

## 2016-09-05 DIAGNOSIS — M549 Dorsalgia, unspecified: Secondary | ICD-10-CM | POA: Diagnosis not present

## 2016-10-04 DIAGNOSIS — M75100 Unspecified rotator cuff tear or rupture of unspecified shoulder, not specified as traumatic: Secondary | ICD-10-CM | POA: Diagnosis not present

## 2016-10-18 DIAGNOSIS — M25512 Pain in left shoulder: Secondary | ICD-10-CM | POA: Diagnosis not present

## 2016-10-18 DIAGNOSIS — M75102 Unspecified rotator cuff tear or rupture of left shoulder, not specified as traumatic: Secondary | ICD-10-CM | POA: Diagnosis not present

## 2016-11-01 DIAGNOSIS — M5412 Radiculopathy, cervical region: Secondary | ICD-10-CM | POA: Diagnosis not present

## 2016-11-01 DIAGNOSIS — K5792 Diverticulitis of intestine, part unspecified, without perforation or abscess without bleeding: Secondary | ICD-10-CM | POA: Diagnosis not present

## 2016-11-01 DIAGNOSIS — M5136 Other intervertebral disc degeneration, lumbar region: Secondary | ICD-10-CM | POA: Diagnosis not present

## 2016-11-01 DIAGNOSIS — M79606 Pain in leg, unspecified: Secondary | ICD-10-CM | POA: Diagnosis not present

## 2016-11-01 DIAGNOSIS — M549 Dorsalgia, unspecified: Secondary | ICD-10-CM | POA: Diagnosis not present

## 2016-11-01 DIAGNOSIS — M542 Cervicalgia: Secondary | ICD-10-CM | POA: Diagnosis not present

## 2016-11-01 DIAGNOSIS — Z79891 Long term (current) use of opiate analgesic: Secondary | ICD-10-CM | POA: Diagnosis not present

## 2016-11-08 DIAGNOSIS — M25512 Pain in left shoulder: Secondary | ICD-10-CM | POA: Diagnosis not present

## 2016-11-08 DIAGNOSIS — M7522 Bicipital tendinitis, left shoulder: Secondary | ICD-10-CM | POA: Diagnosis not present

## 2016-11-08 DIAGNOSIS — M75102 Unspecified rotator cuff tear or rupture of left shoulder, not specified as traumatic: Secondary | ICD-10-CM | POA: Diagnosis not present

## 2016-12-28 DIAGNOSIS — Z79891 Long term (current) use of opiate analgesic: Secondary | ICD-10-CM | POA: Diagnosis not present

## 2016-12-28 DIAGNOSIS — M542 Cervicalgia: Secondary | ICD-10-CM | POA: Diagnosis not present

## 2016-12-28 DIAGNOSIS — M5136 Other intervertebral disc degeneration, lumbar region: Secondary | ICD-10-CM | POA: Diagnosis not present

## 2016-12-28 DIAGNOSIS — M549 Dorsalgia, unspecified: Secondary | ICD-10-CM | POA: Diagnosis not present

## 2016-12-28 DIAGNOSIS — M5412 Radiculopathy, cervical region: Secondary | ICD-10-CM | POA: Diagnosis not present

## 2016-12-28 DIAGNOSIS — M79606 Pain in leg, unspecified: Secondary | ICD-10-CM | POA: Diagnosis not present

## 2016-12-28 DIAGNOSIS — K5792 Diverticulitis of intestine, part unspecified, without perforation or abscess without bleeding: Secondary | ICD-10-CM | POA: Diagnosis not present

## 2017-01-10 DIAGNOSIS — M4696 Unspecified inflammatory spondylopathy, lumbar region: Secondary | ICD-10-CM | POA: Diagnosis not present

## 2017-01-10 DIAGNOSIS — Z6837 Body mass index (BMI) 37.0-37.9, adult: Secondary | ICD-10-CM | POA: Diagnosis not present

## 2017-01-10 DIAGNOSIS — M545 Low back pain: Secondary | ICD-10-CM | POA: Diagnosis not present

## 2017-02-23 DIAGNOSIS — M5412 Radiculopathy, cervical region: Secondary | ICD-10-CM | POA: Diagnosis not present

## 2017-02-23 DIAGNOSIS — M5136 Other intervertebral disc degeneration, lumbar region: Secondary | ICD-10-CM | POA: Diagnosis not present

## 2017-02-23 DIAGNOSIS — Z79891 Long term (current) use of opiate analgesic: Secondary | ICD-10-CM | POA: Diagnosis not present

## 2017-02-23 DIAGNOSIS — M79606 Pain in leg, unspecified: Secondary | ICD-10-CM | POA: Diagnosis not present

## 2017-02-23 DIAGNOSIS — M542 Cervicalgia: Secondary | ICD-10-CM | POA: Diagnosis not present

## 2017-02-23 DIAGNOSIS — M549 Dorsalgia, unspecified: Secondary | ICD-10-CM | POA: Diagnosis not present

## 2017-02-23 DIAGNOSIS — K5792 Diverticulitis of intestine, part unspecified, without perforation or abscess without bleeding: Secondary | ICD-10-CM | POA: Diagnosis not present

## 2017-04-20 DIAGNOSIS — M5136 Other intervertebral disc degeneration, lumbar region: Secondary | ICD-10-CM | POA: Diagnosis not present

## 2017-04-20 DIAGNOSIS — M549 Dorsalgia, unspecified: Secondary | ICD-10-CM | POA: Diagnosis not present

## 2017-04-20 DIAGNOSIS — K5792 Diverticulitis of intestine, part unspecified, without perforation or abscess without bleeding: Secondary | ICD-10-CM | POA: Diagnosis not present

## 2017-04-20 DIAGNOSIS — Z79891 Long term (current) use of opiate analgesic: Secondary | ICD-10-CM | POA: Diagnosis not present

## 2017-04-20 DIAGNOSIS — M79606 Pain in leg, unspecified: Secondary | ICD-10-CM | POA: Diagnosis not present

## 2017-04-20 DIAGNOSIS — M5412 Radiculopathy, cervical region: Secondary | ICD-10-CM | POA: Diagnosis not present

## 2017-04-20 DIAGNOSIS — M542 Cervicalgia: Secondary | ICD-10-CM | POA: Diagnosis not present

## 2017-05-02 DIAGNOSIS — M4696 Unspecified inflammatory spondylopathy, lumbar region: Secondary | ICD-10-CM | POA: Diagnosis not present

## 2017-05-02 DIAGNOSIS — M47816 Spondylosis without myelopathy or radiculopathy, lumbar region: Secondary | ICD-10-CM | POA: Diagnosis not present

## 2017-05-02 DIAGNOSIS — M961 Postlaminectomy syndrome, not elsewhere classified: Secondary | ICD-10-CM | POA: Diagnosis not present

## 2017-05-03 DIAGNOSIS — Z6839 Body mass index (BMI) 39.0-39.9, adult: Secondary | ICD-10-CM | POA: Diagnosis not present

## 2017-05-03 DIAGNOSIS — M47816 Spondylosis without myelopathy or radiculopathy, lumbar region: Secondary | ICD-10-CM | POA: Diagnosis not present

## 2017-05-03 DIAGNOSIS — I10 Essential (primary) hypertension: Secondary | ICD-10-CM | POA: Diagnosis not present

## 2017-05-20 DIAGNOSIS — Z23 Encounter for immunization: Secondary | ICD-10-CM | POA: Diagnosis not present

## 2017-06-08 DIAGNOSIS — F419 Anxiety disorder, unspecified: Secondary | ICD-10-CM | POA: Insufficient documentation

## 2017-06-08 DIAGNOSIS — F1190 Opioid use, unspecified, uncomplicated: Secondary | ICD-10-CM | POA: Insufficient documentation

## 2017-06-08 DIAGNOSIS — Z9049 Acquired absence of other specified parts of digestive tract: Secondary | ICD-10-CM | POA: Insufficient documentation

## 2017-06-08 DIAGNOSIS — Z8719 Personal history of other diseases of the digestive system: Secondary | ICD-10-CM | POA: Insufficient documentation

## 2017-06-08 DIAGNOSIS — I10 Essential (primary) hypertension: Secondary | ICD-10-CM | POA: Insufficient documentation

## 2017-06-08 DIAGNOSIS — M5030 Other cervical disc degeneration, unspecified cervical region: Secondary | ICD-10-CM | POA: Insufficient documentation

## 2017-06-08 DIAGNOSIS — Z1159 Encounter for screening for other viral diseases: Secondary | ICD-10-CM | POA: Diagnosis not present

## 2017-06-13 IMAGING — RF DG C-ARM 61-120 MIN
1 series · 2 of 2 positions shown · non-contrast
Comparison: None.

CLINICAL DATA: Elective lumbar spine fusion.

EXAM:
DG C-ARM 61-120 MIN; LUMBAR SPINE - 2-3 VIEW

[Series 1: run · 2 of 2 slices shown]
[im 1/2]
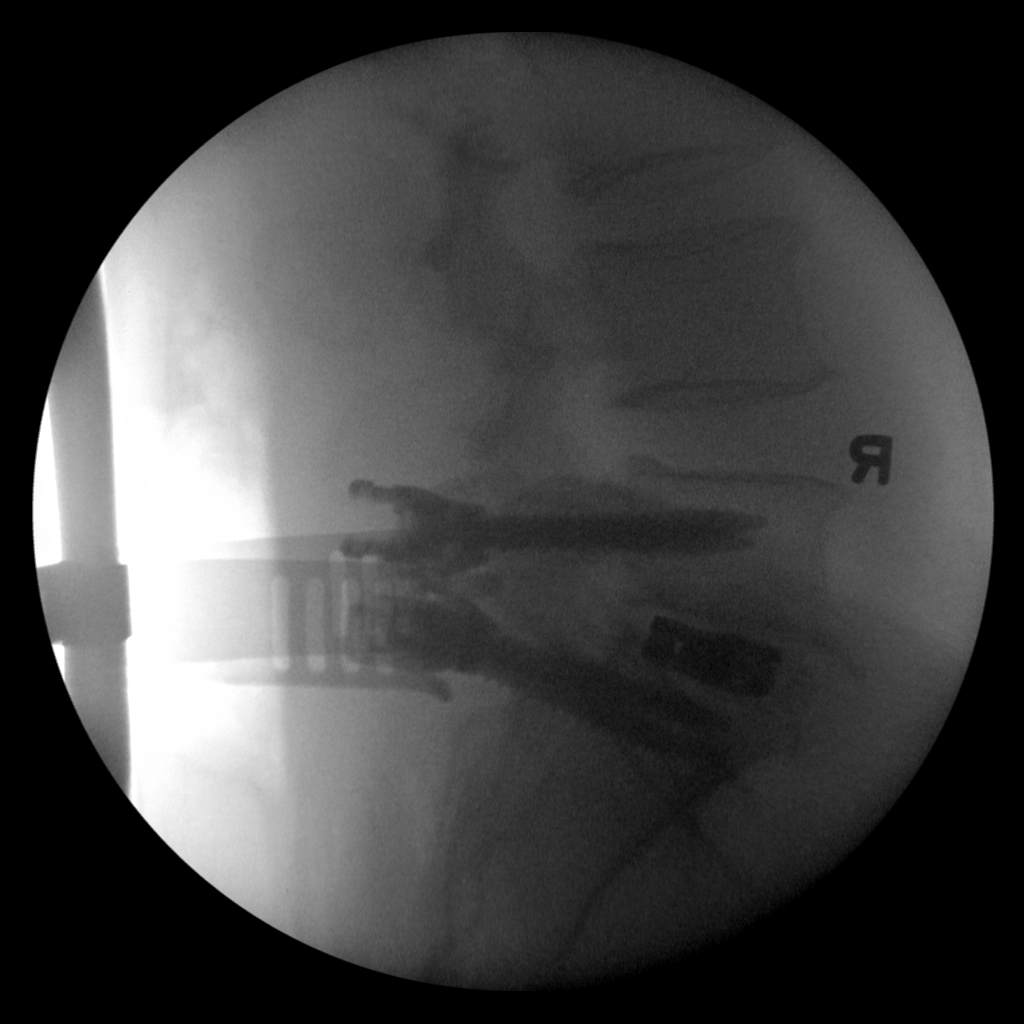
[im 2/2]
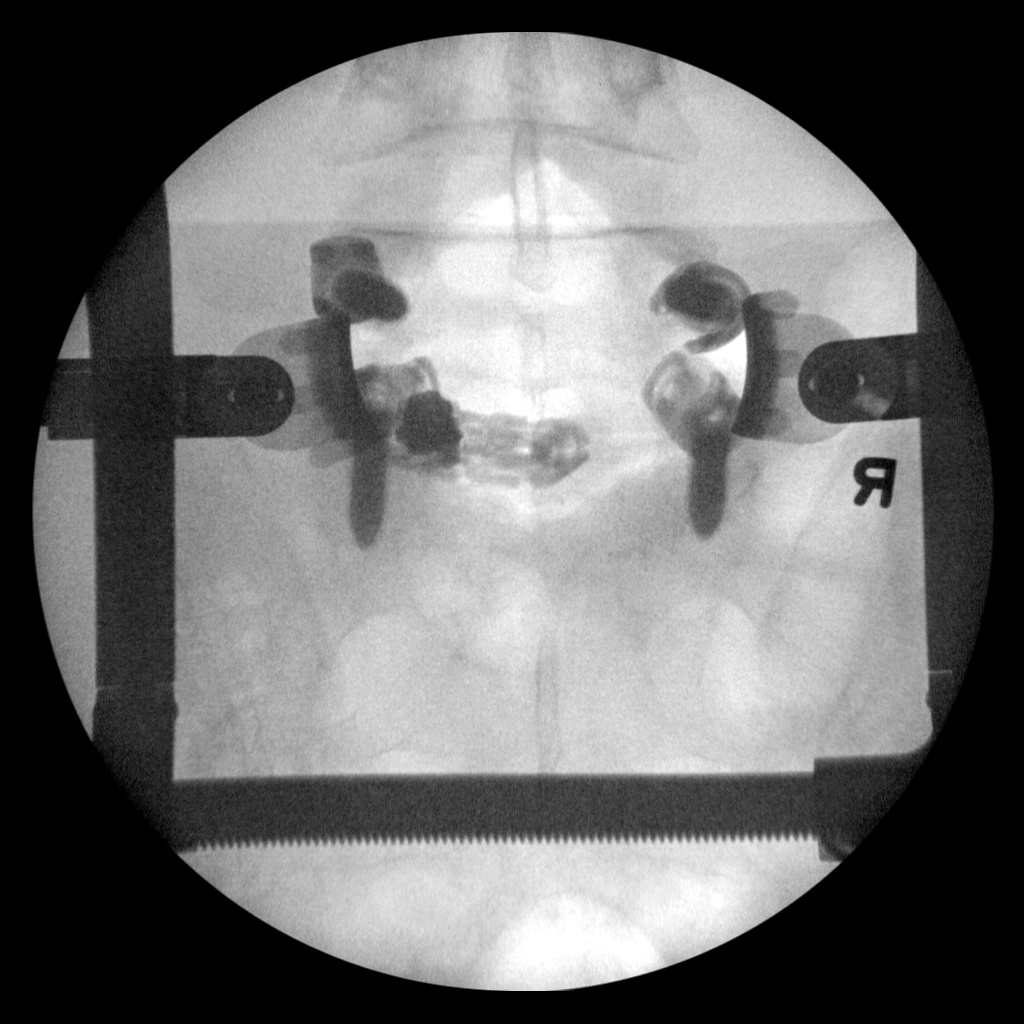

[2 of 2 positions shown; findings below may reference images not displayed]

FINDINGS: Intraoperative AP and lateral spot radiographs show placement of
interbody cages and pedicle screws at level of L5-S1.
IMPRESSION: PLIF hardware placement at L5-S1.

## 2017-06-13 IMAGING — CR DG LUMBAR SPINE 1V
1 series · 1 of 1 positions shown · non-contrast
Comparison: Lumbar spine MRI a 05/01/2015

CLINICAL DATA: Posterior lumbar fusion

EXAM:
LUMBAR SPINE - 1 VIEW

[lat]
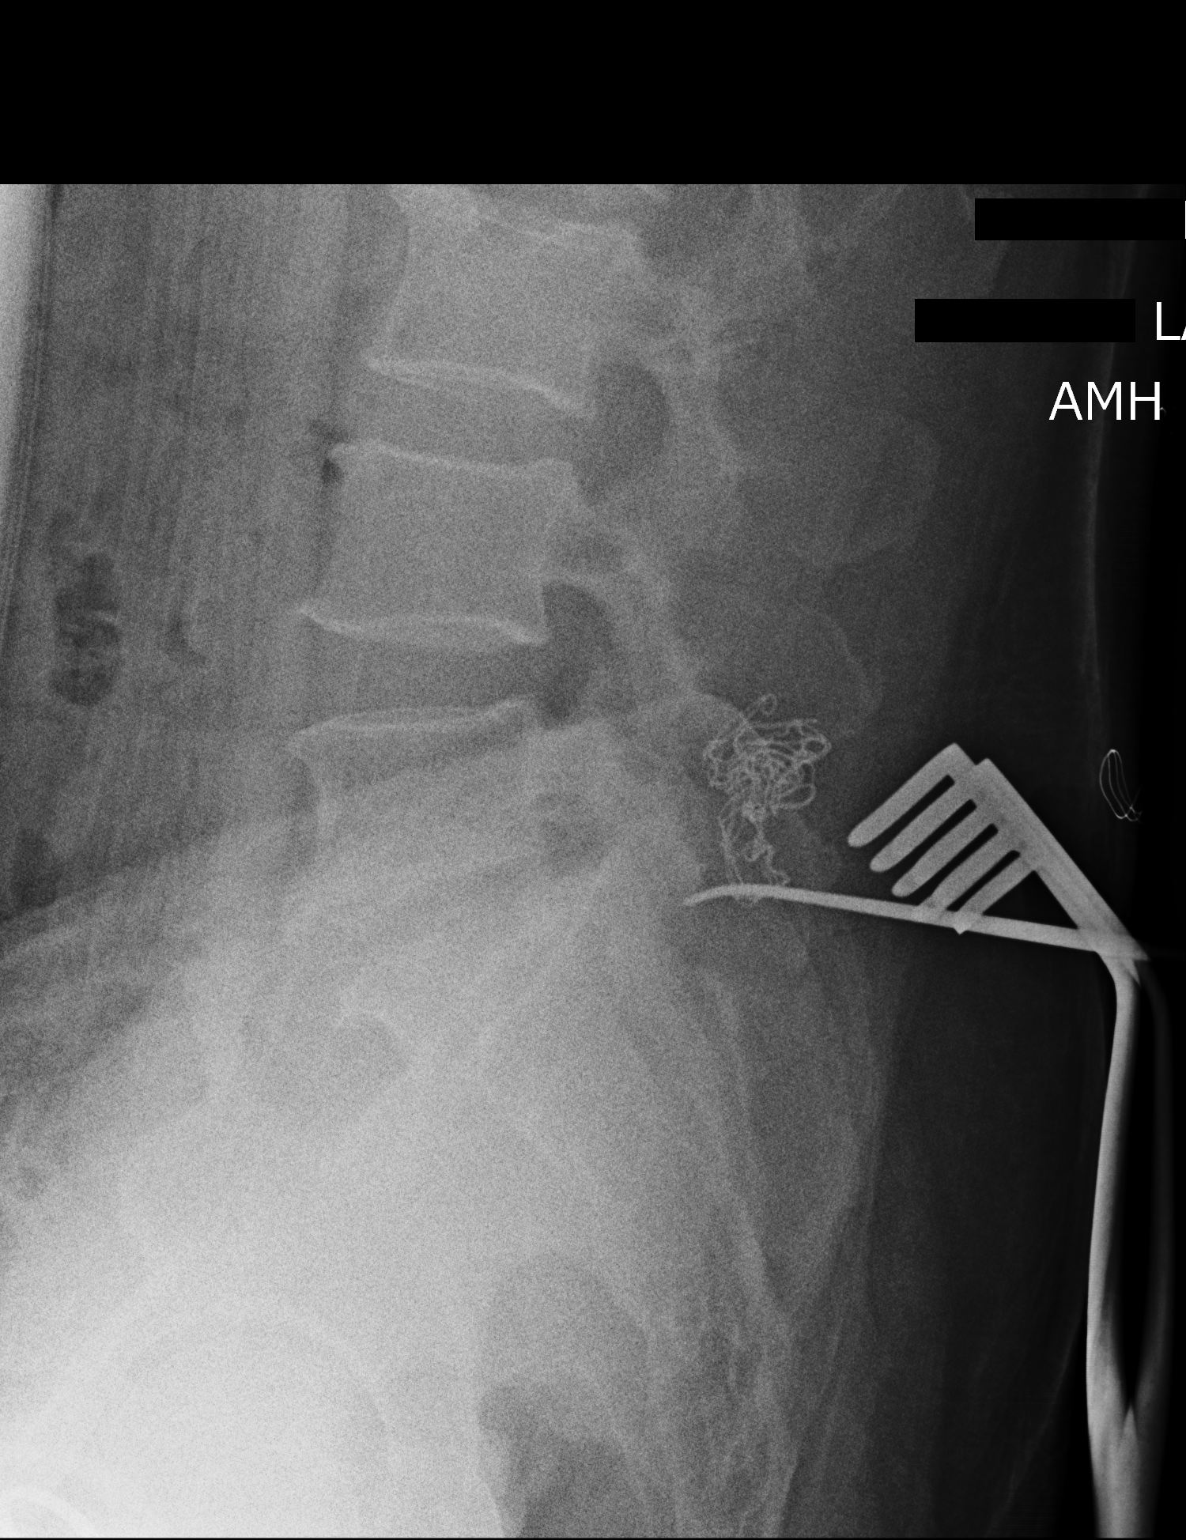

[1 of 1 positions shown; findings below may reference images not displayed]

FINDINGS: Single portable cross-table lateral view of the lumbar spine is
provided. Posterior tissue retractors are present. Occurred tip
metallic probe is identified with the tip projecting along the
inferior margin of the L5 inferior articulating facet.
IMPRESSION: Intraoperative localization as described above.

## 2017-06-20 DIAGNOSIS — M549 Dorsalgia, unspecified: Secondary | ICD-10-CM | POA: Diagnosis not present

## 2017-06-20 DIAGNOSIS — M5412 Radiculopathy, cervical region: Secondary | ICD-10-CM | POA: Diagnosis not present

## 2017-06-20 DIAGNOSIS — Z79891 Long term (current) use of opiate analgesic: Secondary | ICD-10-CM | POA: Diagnosis not present

## 2017-06-20 DIAGNOSIS — M5136 Other intervertebral disc degeneration, lumbar region: Secondary | ICD-10-CM | POA: Diagnosis not present

## 2017-07-11 DIAGNOSIS — M545 Low back pain: Secondary | ICD-10-CM | POA: Diagnosis not present

## 2017-07-11 DIAGNOSIS — M542 Cervicalgia: Secondary | ICD-10-CM | POA: Diagnosis not present

## 2017-07-11 DIAGNOSIS — M4696 Unspecified inflammatory spondylopathy, lumbar region: Secondary | ICD-10-CM | POA: Diagnosis not present

## 2017-07-11 DIAGNOSIS — Z6839 Body mass index (BMI) 39.0-39.9, adult: Secondary | ICD-10-CM | POA: Diagnosis not present

## 2017-08-17 DIAGNOSIS — M5010 Cervical disc disorder with radiculopathy, unspecified cervical region: Secondary | ICD-10-CM | POA: Diagnosis not present

## 2017-08-17 DIAGNOSIS — M542 Cervicalgia: Secondary | ICD-10-CM | POA: Diagnosis not present

## 2017-08-17 DIAGNOSIS — M545 Low back pain: Secondary | ICD-10-CM | POA: Diagnosis not present

## 2017-08-17 DIAGNOSIS — Z79891 Long term (current) use of opiate analgesic: Secondary | ICD-10-CM | POA: Diagnosis not present

## 2017-08-23 ENCOUNTER — Encounter: Payer: Self-pay | Admitting: Family Medicine

## 2017-08-23 ENCOUNTER — Other Ambulatory Visit: Payer: Self-pay

## 2017-08-23 ENCOUNTER — Ambulatory Visit (INDEPENDENT_AMBULATORY_CARE_PROVIDER_SITE_OTHER): Payer: Medicare Other | Admitting: Family Medicine

## 2017-08-23 VITALS — BP 100/76 | HR 100 | Temp 97.1°F | Resp 18 | Ht 71.0 in | Wt 287.0 lb

## 2017-08-23 DIAGNOSIS — M5137 Other intervertebral disc degeneration, lumbosacral region: Secondary | ICD-10-CM | POA: Diagnosis not present

## 2017-08-23 DIAGNOSIS — D369 Benign neoplasm, unspecified site: Secondary | ICD-10-CM | POA: Insufficient documentation

## 2017-08-23 DIAGNOSIS — Z8719 Personal history of other diseases of the digestive system: Secondary | ICD-10-CM

## 2017-08-23 DIAGNOSIS — F1190 Opioid use, unspecified, uncomplicated: Secondary | ICD-10-CM | POA: Diagnosis not present

## 2017-08-23 DIAGNOSIS — E785 Hyperlipidemia, unspecified: Secondary | ICD-10-CM | POA: Diagnosis not present

## 2017-08-23 DIAGNOSIS — I10 Essential (primary) hypertension: Secondary | ICD-10-CM | POA: Diagnosis not present

## 2017-08-23 DIAGNOSIS — M51379 Other intervertebral disc degeneration, lumbosacral region without mention of lumbar back pain or lower extremity pain: Secondary | ICD-10-CM | POA: Insufficient documentation

## 2017-08-23 MED ORDER — CIPROFLOXACIN HCL 500 MG PO TABS: 500.0000 mg | ORAL_TABLET | Freq: Two times a day (BID) | ORAL | 0 refills | Status: AC

## 2017-08-23 MED ORDER — METRONIDAZOLE 500 MG PO TABS: 500.0000 mg | ORAL_TABLET | Freq: Three times a day (TID) | ORAL | 0 refills | Status: AC

## 2017-08-23 NOTE — Patient Instructions (Addendum)
Need records from Dr Wenda Overland Need records Dr Merlene Laughter  Take the antibiotic if needed  Continue same medication Walk every day that you are able Reduce the cholesterol in your diet  Call sooner for problems

## 2017-08-23 NOTE — Progress Notes (Signed)
Chief Complaint  Patient presents with  . Hypertension   This is a new patient to establish. He was previously under the care of Dr. Wenda Overland, who passed away Is a chronic pain patient who is under the care of Dr. Merlene Laughter, and Marita Snellen NP.  Did prescribe for him Dilaudid, Xanax, Zanaflex, ibuprofen, and gabapentin.  I discussed with him that the CDC recommends not combining opiates and benzodiazepines secondary to risk of overdose and sedation.  Patient tells me that he discusses this with Ms. Powell at every visit. He has a history of severe diverticulosis and diverticulitis.  He has had a partial colectomy because of multiple frequent diverticulitis flares.  He is on a high fiber diet.  He eats oatmeal every day and takes Benefiber.  In spite of this he has 2 or 3 flares a year.  He states that his previous physician gave him antibiotics to keep at home to take at the first sign of a flare.  He is familiar with the symptoms.  I will give him one prescription for ciprofloxacin and metronidazole to use in case of attack.  I will request his old records. He has hypertension that is well controlled.  He has hyperlipidemia that is never been treated.  He has a low HDL at 31, and I calculated his 10-year cardiovascular risk via the Framingham study, and it is 16.6%.  I advised him to take a statin.  He refuses, and states that he will try diet for 6 months and then repeat lipids. He has had a flu shot.  He is uncertain about pneumonia shots.  He has not had shingles shots because of the cost. His last colonoscopy was 2012.  He had a tubular adenoma.  He is overdue for repeat colonoscopy for colon cancer screening.  Further, his mother died of colon cancer in her early 57s.  He refuses colonoscopy screening because he states he simply cannot afford it, he still paying off doctors bills for surgery 2 years ago.  Patient Active Problem List   Diagnosis Date Noted  . DDD (degenerative disc disease),  lumbosacral 08/23/2017  . Tubular adenoma 08/23/2017  . Hyperlipidemia 08/23/2017  . Anxiety 06/08/2017  . Chronic narcotic use 06/08/2017  . History of diverticulitis 06/08/2017  . History of diverticulosis 06/08/2017  . History of partial colectomy 06/08/2017  . HTN (hypertension), benign 06/08/2017  . Morbid obesity ([place]) 06/08/2017  . DDD (degenerative disc disease), cervical 06/08/2017  . Lumbar degenerative disc disease 02/04/2016    Outpatient Encounter Medications as of 08/23/2017  Medication Sig  . ALPRAZolam (XANAX) 1 MG tablet Take 1 mg by mouth 2 (two) times daily as needed for anxiety.   . Cholecalciferol (VITAMIN D PO) Take 1 tablet by mouth daily.  Marland Kitchen gabapentin (NEURONTIN) 800 MG tablet Take 800 mg by mouth every 6 (six) hours as needed (for pain).   Marland Kitchen HYDROmorphone (DILAUDID) 4 MG tablet Take 4 mg by mouth every 4 (four) hours as needed for severe pain.  Marland Kitchen ibuprofen (ADVIL,MOTRIN) 800 MG tablet Take 800 mg by mouth every 8 (eight) hours as needed for moderate pain.   Marland Kitchen lisinopril-hydrochlorothiazide (PRINZIDE,ZESTORETIC) 20-25 MG tablet Take 1 tablet by mouth daily.  . Multiple Vitamin (MULTIVITAMIN) tablet Take 1 tablet by mouth daily.  Marland Kitchen tiZANidine (ZANAFLEX) 4 MG tablet Take 4 mg by mouth every 8 (eight) hours as needed for muscle spasms.  . ciprofloxacin (CIPRO) 500 MG tablet Take 1 tablet (500 mg total) by mouth  2 (two) times daily.  . metroNIDAZOLE (FLAGYL) 500 MG tablet Take 1 tablet (500 mg total) by mouth 3 (three) times daily.   No facility-administered encounter medications on file as of 08/23/2017.     Past Medical History:  Diagnosis Date  . Allergy   . Anxiety   . Complication of anesthesia    pt was combative with right rotator cuff  . DDD (degenerative disc disease)    neck back and right knee  . Depression   . Diverticulitis    2007  . GERD (gastroesophageal reflux disease)   . Hyperlipidemia   . Hypertension   . Substance abuse ([place])     Alcoholism, hospitalization for protracted vomiting/blood 30-40 years ago    Past Surgical History:  Procedure Laterality Date  . COLON RESECTION N/A 04/22/2013   Procedure: LAPAROSCOPIC HAND ASSISTED PARTIAL COLECTOMY ;  Surgeon: Jamesetta So, MD;  Location: [facility];  Service: General;  Laterality: N/A;  . HERNIA REPAIR Right    child  . NECK SURGERY    . rt rotator cuff     shoulder  . SPINE SURGERY      Social History   Socioeconomic History  . Marital status: Divorced    Spouse name: Not on file  . Number of children: 2  . Years of education: 44  . Highest education level: High school graduate  Social Needs  . Financial resource strain: Somewhat hard  . Food insecurity - worry: Never true  . Food insecurity - inability: Never true  . Transportation needs - medical: No  . Transportation needs - non-medical: No  Occupational History  . Occupation: ssd    Comment: Previous Games developer  Tobacco Use  . Smoking status: Never Smoker  . Smokeless tobacco: Never Used  Substance and Sexual Activity  . Alcohol use: No    Comment: rarely  . Drug use: No  . Sexual activity: Yes    Birth control/protection: None  Other Topics Concern  . Not on file  Social History Narrative   Lives alone   Is disabled from back pain/mult surgeries    Family History  Problem Relation Age of Onset  . Cancer Mother 38       colon diagnosed at 30  . Heart disease Father        bypass 73, died age 42  . Depression Sister   . Macular degeneration Brother   . Heart disease Sister     Review of Systems  Constitutional: Negative for chills, fever and weight loss.  HENT: Negative for congestion and hearing loss.   Eyes: Negative for blurred vision and pain.  Respiratory: Negative for cough and shortness of breath.   Cardiovascular: Negative for chest pain and leg swelling.  Gastrointestinal: Negative for abdominal pain, constipation, diarrhea and heartburn.  Genitourinary: Negative  for dysuria and frequency.  Musculoskeletal: Positive for back pain, joint pain and neck pain. Negative for falls and myalgias.  Neurological: Negative for dizziness, seizures and headaches.  Psychiatric/Behavioral: Positive for depression. The patient is nervous/anxious and has insomnia.        Discussed.  Refuses SSRI    BP 100/76 (BP Location: Left Arm, Patient Position: Sitting, Cuff Size: Large)   Pulse 100   Temp (!) 97.1 F (36.2 C) (Temporal)   Resp 18   Ht 5\' 11"  (1.803 m)   Wt 287 lb (130.2 kg)   SpO2 95%   BMI 40.03 kg/m   Physical Exam  Constitutional:  He is oriented to person, place, and time. He appears well-developed and well-nourished.  HENT:  Head: Normocephalic and atraumatic.  Mouth/Throat: Oropharynx is clear and moist.  Eyes: Conjunctivae are normal. Pupils are equal, round, and reactive to light.  Neck: Normal range of motion. Neck supple. No thyromegaly present.  Cardiovascular: Normal rate, regular rhythm and normal heart sounds.  Pulmonary/Chest: Effort normal and breath sounds normal. No respiratory distress.  Musculoskeletal: He exhibits no edema.  Appears stiff and uncomfortable.  Repositions frequently in exam chair  Lymphadenopathy:    He has no cervical adenopathy.  Neurological: He is alert and oriented to person, place, and time.  Gait normal  Skin: Skin is warm and dry.  Psychiatric: He has a normal mood and affect. His behavior is normal.  Nursing note and vitals reviewed. ASSESSMENT/PLAN:   1. HTN (hypertension), benign Well-controlled  2. DDD (degenerative disc disease), lumbosacral Chronic pain  3. Tubular adenoma By history, due for colonoscopy  4. Chronic narcotic use Under care of pain clinic  5. History of diverticulosis Refilled antibiotics  Greater than 50% of this visit was spent in counseling and coordinating care.  Total face to face time:   45 minutes is spent in discussing chronic pain management, and the  combination of medications he is taking, and safety.  We also discussed his anxiety and depression and that he likely needs to be on an SSRI.  He states "I am not a [place] pig" and wants to stay on the medicines he is on now because "Xanax works".    Patient Instructions  Need records from Dr Wenda Overland Need records Dr Merlene Laughter  Take the antibiotic if needed  Continue same medication Walk every day that you are able Reduce the cholesterol in your diet  Call sooner for problems   See me in 6 months  Raylene Everts, MD

## 2017-08-24 DIAGNOSIS — L570 Actinic keratosis: Secondary | ICD-10-CM | POA: Diagnosis not present

## 2017-08-24 DIAGNOSIS — X32XXXA Exposure to sunlight, initial encounter: Secondary | ICD-10-CM | POA: Diagnosis not present

## 2017-08-24 DIAGNOSIS — L218 Other seborrheic dermatitis: Secondary | ICD-10-CM | POA: Diagnosis not present

## 2017-08-24 DIAGNOSIS — L820 Inflamed seborrheic keratosis: Secondary | ICD-10-CM | POA: Diagnosis not present

## 2017-08-24 DIAGNOSIS — Z1283 Encounter for screening for malignant neoplasm of skin: Secondary | ICD-10-CM | POA: Diagnosis not present

## 2017-08-30 DIAGNOSIS — Z6839 Body mass index (BMI) 39.0-39.9, adult: Secondary | ICD-10-CM | POA: Diagnosis not present

## 2017-08-30 DIAGNOSIS — I10 Essential (primary) hypertension: Secondary | ICD-10-CM | POA: Diagnosis not present

## 2017-08-30 DIAGNOSIS — M47816 Spondylosis without myelopathy or radiculopathy, lumbar region: Secondary | ICD-10-CM | POA: Diagnosis not present

## 2017-09-13 ENCOUNTER — Encounter: Payer: Self-pay | Admitting: Family Medicine

## 2017-09-13 DIAGNOSIS — R319 Hematuria, unspecified: Secondary | ICD-10-CM | POA: Insufficient documentation

## 2017-10-17 DIAGNOSIS — M545 Low back pain: Secondary | ICD-10-CM | POA: Diagnosis not present

## 2017-10-17 DIAGNOSIS — Z79891 Long term (current) use of opiate analgesic: Secondary | ICD-10-CM | POA: Diagnosis not present

## 2017-10-17 DIAGNOSIS — M542 Cervicalgia: Secondary | ICD-10-CM | POA: Diagnosis not present

## 2017-10-17 DIAGNOSIS — M5010 Cervical disc disorder with radiculopathy, unspecified cervical region: Secondary | ICD-10-CM | POA: Diagnosis not present

## 2017-10-31 ENCOUNTER — Telehealth: Payer: Self-pay | Admitting: Family Medicine

## 2017-11-01 ENCOUNTER — Ambulatory Visit (INDEPENDENT_AMBULATORY_CARE_PROVIDER_SITE_OTHER): Payer: Medicare HMO | Admitting: Family Medicine

## 2017-11-01 ENCOUNTER — Encounter: Payer: Self-pay | Admitting: Family Medicine

## 2017-11-01 VITALS — BP 128/82 | HR 76 | Temp 97.7°F | Ht 71.0 in | Wt 283.0 lb

## 2017-11-01 DIAGNOSIS — R21 Rash and other nonspecific skin eruption: Secondary | ICD-10-CM | POA: Diagnosis not present

## 2017-11-01 NOTE — Telephone Encounter (Signed)
x

## 2017-11-01 NOTE — Patient Instructions (Signed)
Take the prednisone 2 x a day Call if not improving by Friday

## 2017-11-01 NOTE — Progress Notes (Signed)
Chief Complaint  Patient presents with  . Rash    burning, itchy, spreading on left leg   Patient is here for a "rash".  He has discoloration of his skin on his left buttock down his left leg.  It has been present 2 days.  There is some itching and burning to the skin.  Further down his leg at the site of his knee he has increased pain.  Pain with palpation and range of motion.  The skin in the lower area feels numb to him.  He denies any new soap lotion or powder.  He denies using a heating pad or any cream in this area.  He has chronic low back pain with right-sided radiculopathy, not usually any neuro symptoms on the left.  No weakness in the leg.  He states that it was darker yesterday and this morning but is fading.  He drew ankle marks around the edge and is clearly smaller than it was yesterday.  No wound.  No pain or fever.  Never had similar reaction.   Patient Active Problem List   Diagnosis Date Noted  . Hematuria 09/13/2017  . DDD (degenerative disc disease), lumbosacral 08/23/2017  . Tubular adenoma 08/23/2017  . Hyperlipidemia 08/23/2017  . Anxiety 06/08/2017  . Chronic narcotic use 06/08/2017  . History of diverticulitis 06/08/2017  . History of diverticulosis 06/08/2017  . History of partial colectomy 06/08/2017  . HTN (hypertension), benign 06/08/2017  . Morbid obesity ([place]) 06/08/2017  . DDD (degenerative disc disease), cervical 06/08/2017  . Lumbar degenerative disc disease 02/04/2016    Outpatient Encounter Medications as of 11/01/2017  Medication Sig  . ALPRAZolam (XANAX) 1 MG tablet Take 1 mg by mouth 2 (two) times daily as needed for anxiety.   Marland Kitchen b complex vitamins tablet Take 1 tablet by mouth daily.  . Cholecalciferol (VITAMIN D PO) Take 1 tablet by mouth daily.  Marland Kitchen gabapentin (NEURONTIN) 800 MG tablet Take 800 mg by mouth every 6 (six) hours as needed (for pain).   Marland Kitchen HYDROmorphone (DILAUDID) 4 MG tablet Take 4 mg by mouth every 4 (four) hours as needed for  severe pain.  Marland Kitchen ibuprofen (ADVIL,MOTRIN) 800 MG tablet Take 800 mg by mouth every 8 (eight) hours as needed for moderate pain.   Marland Kitchen lisinopril (PRINIVIL,ZESTRIL) 10 MG tablet   . Multiple Vitamin (MULTIVITAMIN) tablet Take 1 tablet by mouth daily.  . Multiple Vitamins-Minerals (ZINC PO) Take by mouth.  Marland Kitchen tiZANidine (ZANAFLEX) 4 MG tablet Take 4 mg by mouth every 8 (eight) hours as needed for muscle spasms.  . ciprofloxacin (CIPRO) 500 MG tablet Take 1 tablet (500 mg total) by mouth 2 (two) times daily. (Patient not taking: Reported on 11/01/2017)  . lisinopril-hydrochlorothiazide (PRINZIDE,ZESTORETIC) 20-25 MG tablet Take 1 tablet by mouth daily.  . metroNIDAZOLE (FLAGYL) 500 MG tablet Take 1 tablet (500 mg total) by mouth 3 (three) times daily. (Patient not taking: Reported on 11/01/2017)   No facility-administered encounter medications on file as of 11/01/2017.     Allergies  Allergen Reactions  . Penicillins Other (See Comments)    Has patient had a PCN reaction causing immediate rash, facial/tongue/throat swelling, SOB or lightheadedness with hypotension: Unknown, childhood reaction Has patient had a PCN reaction causing severe rash involving mucus membranes or skin necrosis: No Has patient had a PCN reaction that required hospitalization No Has patient had a PCN reaction occurring within the last 10 years: No If all of the above answers are "NO",  then may proceed with Cephalosporin use.    Review of Systems  Constitutional: Negative for activity change, appetite change, chills, fatigue, fever and unexpected weight change.  Musculoskeletal: Positive for arthralgias, back pain and gait problem.  Skin: Positive for color change.  Psychiatric/Behavioral: Negative for sleep disturbance.  All other systems reviewed and are negative.   BP 128/82 (BP Location: Right Arm, Patient Position: Sitting, Cuff Size: Normal)   Pulse 76   Temp 97.7 F (36.5 C) (Temporal)   Ht 5\' 11"  (1.803 m)   Wt  283 lb (128.4 kg)   SpO2 96%   BMI 39.47 kg/m   Physical Exam  Constitutional: He is oriented to person, place, and time. He appears well-developed and well-nourished. No distress.  HENT:  Head: Normocephalic and atraumatic.  Mouth/Throat: Oropharynx is clear and moist.  Eyes: Conjunctivae are normal. Pupils are equal, round, and reactive to light.  Cardiovascular: Normal rate, regular rhythm and normal heart sounds.  Pulmonary/Chest: Effort normal and breath sounds normal.  Musculoskeletal:       Legs: Legs measure equal circumference mid thigh and mid calf  Neurological: He is alert and oriented to person, place, and time.  Psychiatric: He has a normal mood and affect. His behavior is normal.    ASSESSMENT/PLAN:  1. Rash and nonspecific skin eruption This area of skin is discolored, but no inflammation or infection identified.  It looks most like hyperpigmentation/pink skin from a heating pad which patient denies.  Since it is resolving on its own, no additional treatment is indicated.  He has prednisone at home that is going to take for a couple of days.  He is going to call me if it is not better by Friday   Patient Instructions  Take the prednisone 2 x a day Call if not improving by Friday   Raylene Everts, MD

## 2017-11-02 DIAGNOSIS — S30861A Insect bite (nonvenomous) of abdominal wall, initial encounter: Secondary | ICD-10-CM | POA: Diagnosis not present

## 2017-11-02 DIAGNOSIS — L281 Prurigo nodularis: Secondary | ICD-10-CM | POA: Diagnosis not present

## 2017-11-02 DIAGNOSIS — L818 Other specified disorders of pigmentation: Secondary | ICD-10-CM | POA: Diagnosis not present

## 2017-11-06 ENCOUNTER — Encounter: Payer: Self-pay | Admitting: Family Medicine

## 2017-11-08 DIAGNOSIS — M47816 Spondylosis without myelopathy or radiculopathy, lumbar region: Secondary | ICD-10-CM | POA: Diagnosis not present

## 2017-11-15 DIAGNOSIS — M47816 Spondylosis without myelopathy or radiculopathy, lumbar region: Secondary | ICD-10-CM | POA: Diagnosis not present

## 2017-11-27 DIAGNOSIS — E785 Hyperlipidemia, unspecified: Secondary | ICD-10-CM | POA: Diagnosis not present

## 2017-11-27 DIAGNOSIS — I10 Essential (primary) hypertension: Secondary | ICD-10-CM | POA: Diagnosis not present

## 2017-11-27 DIAGNOSIS — K5730 Diverticulosis of large intestine without perforation or abscess without bleeding: Secondary | ICD-10-CM | POA: Diagnosis not present

## 2017-11-27 DIAGNOSIS — F419 Anxiety disorder, unspecified: Secondary | ICD-10-CM | POA: Diagnosis not present

## 2017-11-27 DIAGNOSIS — G894 Chronic pain syndrome: Secondary | ICD-10-CM | POA: Diagnosis not present

## 2017-11-30 DIAGNOSIS — M542 Cervicalgia: Secondary | ICD-10-CM | POA: Diagnosis not present

## 2017-11-30 DIAGNOSIS — M545 Low back pain: Secondary | ICD-10-CM | POA: Diagnosis not present

## 2017-11-30 DIAGNOSIS — M5010 Cervical disc disorder with radiculopathy, unspecified cervical region: Secondary | ICD-10-CM | POA: Diagnosis not present

## 2017-11-30 DIAGNOSIS — Z79891 Long term (current) use of opiate analgesic: Secondary | ICD-10-CM | POA: Diagnosis not present

## 2018-02-14 DIAGNOSIS — M545 Low back pain: Secondary | ICD-10-CM | POA: Diagnosis not present

## 2018-02-14 DIAGNOSIS — M542 Cervicalgia: Secondary | ICD-10-CM | POA: Diagnosis not present

## 2018-02-14 DIAGNOSIS — Z79891 Long term (current) use of opiate analgesic: Secondary | ICD-10-CM | POA: Diagnosis not present

## 2018-02-14 DIAGNOSIS — M5010 Cervical disc disorder with radiculopathy, unspecified cervical region: Secondary | ICD-10-CM | POA: Diagnosis not present

## 2018-05-17 DIAGNOSIS — M542 Cervicalgia: Secondary | ICD-10-CM | POA: Diagnosis not present

## 2018-05-17 DIAGNOSIS — M545 Low back pain: Secondary | ICD-10-CM | POA: Diagnosis not present

## 2018-05-17 DIAGNOSIS — Z79891 Long term (current) use of opiate analgesic: Secondary | ICD-10-CM | POA: Diagnosis not present

## 2018-05-17 DIAGNOSIS — M5010 Cervical disc disorder with radiculopathy, unspecified cervical region: Secondary | ICD-10-CM | POA: Diagnosis not present

## 2018-05-29 DIAGNOSIS — M519 Unspecified thoracic, thoracolumbar and lumbosacral intervertebral disc disorder: Secondary | ICD-10-CM | POA: Diagnosis not present

## 2018-05-29 DIAGNOSIS — Z23 Encounter for immunization: Secondary | ICD-10-CM | POA: Diagnosis not present

## 2018-05-29 DIAGNOSIS — F419 Anxiety disorder, unspecified: Secondary | ICD-10-CM | POA: Diagnosis not present

## 2018-05-29 DIAGNOSIS — I10 Essential (primary) hypertension: Secondary | ICD-10-CM | POA: Diagnosis not present

## 2018-05-29 DIAGNOSIS — E785 Hyperlipidemia, unspecified: Secondary | ICD-10-CM | POA: Diagnosis not present

## 2018-05-29 DIAGNOSIS — Z6841 Body Mass Index (BMI) 40.0 and over, adult: Secondary | ICD-10-CM | POA: Diagnosis not present

## 2018-06-27 DIAGNOSIS — H524 Presbyopia: Secondary | ICD-10-CM | POA: Diagnosis not present

## 2018-06-27 DIAGNOSIS — H52209 Unspecified astigmatism, unspecified eye: Secondary | ICD-10-CM | POA: Diagnosis not present

## 2018-06-27 DIAGNOSIS — H5203 Hypermetropia, bilateral: Secondary | ICD-10-CM | POA: Diagnosis not present

## 2018-08-14 DIAGNOSIS — Z79891 Long term (current) use of opiate analgesic: Secondary | ICD-10-CM | POA: Diagnosis not present

## 2018-08-14 DIAGNOSIS — M5010 Cervical disc disorder with radiculopathy, unspecified cervical region: Secondary | ICD-10-CM | POA: Diagnosis not present

## 2018-08-14 DIAGNOSIS — M545 Low back pain: Secondary | ICD-10-CM | POA: Diagnosis not present

## 2018-08-14 DIAGNOSIS — M542 Cervicalgia: Secondary | ICD-10-CM | POA: Diagnosis not present

## 2018-10-30 ENCOUNTER — Encounter (INDEPENDENT_AMBULATORY_CARE_PROVIDER_SITE_OTHER): Payer: Self-pay | Admitting: *Deleted

## 2019-03-20 ENCOUNTER — Other Ambulatory Visit (INDEPENDENT_AMBULATORY_CARE_PROVIDER_SITE_OTHER): Payer: Self-pay | Admitting: *Deleted

## 2019-03-20 DIAGNOSIS — Z8371 Family history of colonic polyps: Secondary | ICD-10-CM

## 2019-03-20 DIAGNOSIS — Z86010 Personal history of colonic polyps: Secondary | ICD-10-CM

## 2019-03-26 ENCOUNTER — Telehealth (INDEPENDENT_AMBULATORY_CARE_PROVIDER_SITE_OTHER): Payer: Self-pay | Admitting: *Deleted

## 2019-03-26 NOTE — Telephone Encounter (Signed)
Referring MD/PCP: [facility]  03/21/19 LM for patient to call to schedule  Procedure: tcs w propofol  Reason/Indication:  Hx polyps, fam hx colon ca  Has patient had this procedure before?  Yes, 01/2011  If so, when, by whom and where?    Is there a family history of colon cancer?  Yes, mother  Who?  What age when diagnosed?    Is patient diabetic?   no      Does patient have prosthetic heart valve or mechanical valve?  no  Do you have a pacemaker?  no  Has patient ever had endocarditis? no  Has patient had joint replacement within last 12 months?  no  Is patient constipated or do they take laxatives? no  Does patient have a history of alcohol/drug use?  no  Is patient on blood thinner such as Coumadin, Plavix and/or Aspirin? no  Medications: gabapentin 800 mg qid, vit d3 5000 iu daily, tizanidine 4 mg 1-3 tab daily, lisinopril/hctz 20/25 mg daily, hydromorphone 4 mg 1-4 tab daily, ibuprofen 800 mg 1-2 tab daily, alprazolam 10 mg bid, betacarotene daily, [place] daily, fish oil daily  Allergies: nkda  Medication Adjustment per Dr Lindi Adie, NP:   Procedure date & time:
